# Patient Record
Sex: Female | Born: 1950 | Race: White | Hispanic: No | Smoking: Never smoker
Health system: Southern US, Community
[De-identification: ages and names within clinical notes are randomized; demographics above are authoritative.]

## PROBLEM LIST (undated history)

## (undated) DIAGNOSIS — F32A Depression, unspecified: Secondary | ICD-10-CM

## (undated) DIAGNOSIS — F419 Anxiety disorder, unspecified: Secondary | ICD-10-CM

## (undated) DIAGNOSIS — F329 Major depressive disorder, single episode, unspecified: Secondary | ICD-10-CM

## (undated) DIAGNOSIS — I10 Essential (primary) hypertension: Secondary | ICD-10-CM

## (undated) HISTORY — DX: Depression, unspecified: F32.A

## (undated) HISTORY — DX: Essential (primary) hypertension: I10

## (undated) HISTORY — PX: WRIST FRACTURE SURGERY: SHX121

## (undated) HISTORY — PX: WRIST SURGERY: SHX841

## (undated) HISTORY — DX: Anxiety disorder, unspecified: F41.9

## (undated) HISTORY — DX: Major depressive disorder, single episode, unspecified: F32.9

---

## 2008-12-10 HISTORY — PX: COLON SURGERY: SHX602

## 2009-11-09 HISTORY — PX: COLON RESECTION: SHX5231

## 2009-12-10 HISTORY — PX: COLON SURGERY: SHX602

## 2010-12-10 HISTORY — PX: OTHER SURGICAL HISTORY: SHX169

## 2014-12-10 DIAGNOSIS — I10 Essential (primary) hypertension: Secondary | ICD-10-CM

## 2014-12-10 HISTORY — DX: Essential (primary) hypertension: I10

## 2018-11-03 ENCOUNTER — Other Ambulatory Visit (HOSPITAL_COMMUNITY): Payer: BLUE CROSS/BLUE SHIELD | Attending: Psychiatry | Admitting: Licensed Clinical Social Worker

## 2018-11-03 DIAGNOSIS — Z818 Family history of other mental and behavioral disorders: Secondary | ICD-10-CM | POA: Insufficient documentation

## 2018-11-03 DIAGNOSIS — Z79899 Other long term (current) drug therapy: Secondary | ICD-10-CM | POA: Diagnosis not present

## 2018-11-03 DIAGNOSIS — Z915 Personal history of self-harm: Secondary | ICD-10-CM | POA: Diagnosis not present

## 2018-11-03 DIAGNOSIS — F332 Major depressive disorder, recurrent severe without psychotic features: Secondary | ICD-10-CM | POA: Diagnosis present

## 2018-11-03 DIAGNOSIS — R4589 Other symptoms and signs involving emotional state: Secondary | ICD-10-CM | POA: Insufficient documentation

## 2018-11-04 ENCOUNTER — Other Ambulatory Visit (HOSPITAL_COMMUNITY): Payer: BLUE CROSS/BLUE SHIELD | Admitting: Occupational Therapy

## 2018-11-04 ENCOUNTER — Encounter (HOSPITAL_COMMUNITY): Payer: Self-pay | Admitting: Family

## 2018-11-04 ENCOUNTER — Encounter (HOSPITAL_COMMUNITY): Payer: Self-pay | Admitting: Occupational Therapy

## 2018-11-04 ENCOUNTER — Other Ambulatory Visit: Payer: Self-pay

## 2018-11-04 ENCOUNTER — Other Ambulatory Visit (HOSPITAL_COMMUNITY): Payer: BLUE CROSS/BLUE SHIELD | Admitting: Licensed Clinical Social Worker

## 2018-11-04 VITALS — BP 128/78 | HR 90 | Ht 68.75 in | Wt 185.0 lb

## 2018-11-04 DIAGNOSIS — F070 Personality change due to known physiological condition: Secondary | ICD-10-CM

## 2018-11-04 DIAGNOSIS — R4589 Other symptoms and signs involving emotional state: Secondary | ICD-10-CM

## 2018-11-04 DIAGNOSIS — F332 Major depressive disorder, recurrent severe without psychotic features: Secondary | ICD-10-CM
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
## 2018-11-04 NOTE — Therapy (Signed)
[facility] [address] [place], [place], [address] Phone: [phone]   Fax:  [phone]  Occupational Therapy Evaluation  Patient Details  Name: Ashley Padilla MRN: [id number] Date of Birth: 13-Jan-1951 No data recorded  Encounter Date: 11/04/2018  OT End of Session - 11/04/18 0921    Visit Number  1    Number of Visits  16    Date for OT Re-Evaluation  12/02/18    Authorization Type  [employer]    Activity Tolerance  Patient tolerated treatment well    Behavior During Therapy  [facility] for tasks assessed/performed       History reviewed. No pertinent past medical history.  History reviewed. No pertinent surgical history.  There were no vitals filed for this visit.  Subjective Assessment - 11/04/18 0919    Currently in Pain?  No/denies       .OT assessment: OCAIRS  Diagnosis:   Past medical history/referral information: Pt presents to [facility] after a suicide attempt and stay in an inpatient hospital out of the [facility] system.  Subjective: Pt presents to group casual, well groomed. Pt is tearful and anxious and occasionally has difficulty coming to conclusions in conversation.  Living situation: Pt currently lives with son and daughter in law, but originally lives alone in [place] (husband has passed since 03-26-12)  ADLs/IADLs: Pt reports a decreased engagement in this area.  Sleep: Pt shares that she sleeps 7 hours with sleep medication  Work: Pt is a retired Education officer, museum  Leisure: Pt currently is not engaging in baseline leisure activities; at baseline pt attends group exercise, walks with two friends daily, takes Chief Technology Officer, and does a Mudlogger class  Social support: Pt identifies family as very supportive. Her two friends do not know the details of her situation, her daughter communicated to them saying that she is staying in [place] for the holidays.  Struggles: Coping skills  [place] Summary  of Client Scores:  FACILITATES PARTICIPATION IN OCCUPATION  ALLOWS PARTICIPATION IN OCCUPATION INHIBITS PARTICIPATION IN OCCUPATION RESTRICTS PARTICIPATION IN OCCUPATION COMMENTS  ROLES               X Pt currently not engaging in roles, significant change from baseline  HABITS               X No established current routine, pt is withdrawn and relocated from usual daily activities  PERSONAL CAUSATION               X Unable to identify areas of pride, becomes tearful; unable to think 6 months in future  VALUES              X  Loosely identifies family, becomes tearful  INTERESTS              X  Currently doing puzzle book, no other previously identified leisure activities noted  SKILLS              X  Pt difficulty with concentrating, states "I don't like to make decisions"; often gets lost in thought  [place]              X  Loosely identifies daily set/lead goals  LONG TERM GOALS               X None stated  INTERPETATION OF PAST EXPERIENCES              X  Increased  emphasis on negative, becomes tearful  PHYSICAL ENVIRONMENT              X  Limited currently being out of usual environment; pt not currently driving  SOCIAL ENVIRONMENT              X  Currently not socially engaging with others than the household  READINESS FOR CHANGE                         X  Difficulty adjusting    Need for Occupational Therapy:  4 Shows positive occupational participation, no need for OT.   3 Need for minimal intervention/consultative participation    X 2 Need for OT intervention indicated to restore/improve participation   1 Need for extensive OT intervention indicated to improve participation.  Referral for follow up services also recommended.    Assessment:  Patient demonstrates behavior that inhibits participation in occupation.  Patient will benefit from occupational therapy intervention in order to improve time management, financial management, stress management, job readiness skills, social  skills, and health management skills in preparation to return to full time community living and to be a productive community member.    Plan:  Patient will participate in skilled occupational therapy sessions individually or in a group setting to improve coping skills, psychosocial skills, and emotional skills required to return to prior level of function.  Treatment will be 4 times per week for 4 weeks.     OT TREATMENT   S: "I have learned to dismiss unhealthy thoughts, which is really helpful"    O: Continued education given on stress management this date. Pt given handouts to identify personal unhealthy vs healthy coping strategies and the outcomes of each. Further education given on stress management skills to use as pt reintegrates into BADL routine. Coping strategies taught include: relaxation based- deep breathing, counting to 10, taking a 1 minute vacation, acceptance, stress balls, relaxation audio/video, visual/mental imagery. Positive mental attitude- gratitude, acceptance, cognitive reframing, positive self talk, anger management.   A: Pt presents to group with blunted affect, engaged and participatory throughout session. Pt identifying that an unhealthy coping mechanisms is ruminating, and a positive one is to dismiss her unhealthy thoughts. Pt in understanding of stress management tools worksheet, stating she would like to engage in deep breathing.   P: OT will continue to follow up with stress management strategies to ensure successful implementaton             OT Education - 11/04/18 0920    Person(s) Educated  Patient    Methods  Explanation;Handout    Comprehension  Verbalized understanding       OT Short Term Goals - 11/04/18 0924      OT SHORT TERM GOAL #1   Title  Pt will be educated on strategies to improve psychosocial skills needed to participate fully in all daily, work, and leisure activities    Time  4    Period  Weeks    Status  New    [place]  Date  12/02/18      OT SHORT TERM GOAL #2   Title  Pt will apply psychosocial skills and coping mechanisms to daily activities in order to function independently and reintegrate into community dwelling    Time  4    Period  Weeks    Status  New    Target Date  12/02/18      OT SHORT TERM  GOAL #3   Title  Pt will recall and/or apply 1-3 sleep hygiene strategies to improve engagement in BADL routine    Time  4    Period  Weeks    Status  New    Target Date  12/02/18      OT SHORT TERM GOAL #4   Title  Pt will engage in goal setting to improve functional BADL/IADL routine upon reintegrating into community    Time  4    Period  Weeks    Status  New    Target Date  12/02/18               Plan - 11/04/18 0998    Occupational performance deficits (Please refer to evaluation for details):  ADL's;IADL's;Rest and Sleep;Leisure;Social Participation    Rehab Potential  Good    OT Frequency  4x / week    OT Duration  4 weeks    OT Treatment/Interventions  Psychosocial skills training;Coping strategies training;Self-care/ADL training;Other (comment)   community reintegration   Consulted and Agree with Plan of Care  Patient       Patient will benefit from skilled therapeutic intervention in order to improve the following deficits and impairments:  Decreased coping skills, Decreased psychosocial skills, Other (comment)(decreaed ability to engage in BADL and reintegrate into community)  Visit Diagnosis: Organic personality disorder  Difficulty coping    Problem List There are no active problems to display for this patient.  Zenovia Jarred, MSOT, OTR/L Behavioral Health OT/ Acute Relief OT PHP Office: [place] 11/04/2018, 9:33 AM  [facility] HOSPITALIZATION PROGRAM [place], [place], [address] Phone: [phone]   Fax:  [phone]  Name: Ashley Padilla MRN: [id number] Date of Birth: 1951/06/08
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
## 2018-11-04 NOTE — Progress Notes (Signed)
Patient presented with sad affect, depressed mood but denied any current suicidal or homicidal ideations, no auditory or visual hallucinations and no plans or intent to want to harm self or others at this time.  Patient reported "I promised my daughter" she would not attempt to harm herself again but admits to still having suicidal thoughts periodically primarily related to her feelings of guilt for past thoughts of attraction to 2 men that were not her husband.  Patient reported when she most recently attempted to overdose on pain medications she had from past surgeries, that she ran into a man she had worked with in a park with friends that she had had attractive thoughts for back in 2013.  Patient stated she never thought she would see him again and then she had this "overwhelming since of guilt" and the thought she had to attempt to harm herself to [facility] for these thoughts and to not bring harm to her children for having these. Patient reports although she does not attend church at this time she has often questioned her salvation and thought she was doing the right thing at the time for having such sinful thoughts. Patient presented with hyper-religious self-conviction and paranoid thinking, primarily around feelings of guilt for thoughts of attraction but admission she had never acted on any of these thoughts.  Patient reported her family, primarily her 3 children are her biggest supports and does not want to harm self currently and wants to feel better.  Patient reported last bout with depression was after having her children but is receptive to medication and attending PHP groups currently.  Discussed PHP with patient and encouraged her to share what she felt comfortable but also challenged her to try skills she would be learning.  Patient reported no current problems with Zoloft now at 150 mg a day since this past week, and also sleeping better with Seroquel at bedtime.  Patient rated her current level of  depression a 5, anxiety a 5, and hopelessness a 7-8 on a scale of 0-10 with 0 being none and 10 the worst she could manage.  Patient agreed to inform this nurse or PHP staff if any worsening of symptoms or if she again started feeling she needed to act on periodic suicidal ideations which she denies any currently.  Patient agreed to also inform this nurse or PHP staff if any problems with medication or side effects.  Patient stated sleeping about 7 sound hours a night now with Seroquel and some improving appetite.

## 2018-11-05 ENCOUNTER — Other Ambulatory Visit (HOSPITAL_COMMUNITY): Payer: BLUE CROSS/BLUE SHIELD | Admitting: Licensed Clinical Social Worker

## 2018-11-05 DIAGNOSIS — F332 Major depressive disorder, recurrent severe without psychotic features: Secondary | ICD-10-CM

## 2018-11-10 ENCOUNTER — Other Ambulatory Visit (HOSPITAL_COMMUNITY): Payer: Medicare Other | Attending: Psychiatry | Admitting: Licensed Clinical Social Worker

## 2018-11-10 ENCOUNTER — Other Ambulatory Visit (HOSPITAL_COMMUNITY): Payer: Medicare Other | Admitting: Occupational Therapy

## 2018-11-10 DIAGNOSIS — Z79899 Other long term (current) drug therapy: Secondary | ICD-10-CM | POA: Insufficient documentation

## 2018-11-10 DIAGNOSIS — F419 Anxiety disorder, unspecified: Secondary | ICD-10-CM | POA: Insufficient documentation

## 2018-11-10 DIAGNOSIS — F070 Personality change due to known physiological condition: Secondary | ICD-10-CM

## 2018-11-10 DIAGNOSIS — Z818 Family history of other mental and behavioral disorders: Secondary | ICD-10-CM | POA: Insufficient documentation

## 2018-11-10 DIAGNOSIS — I10 Essential (primary) hypertension: Secondary | ICD-10-CM | POA: Insufficient documentation

## 2018-11-10 DIAGNOSIS — Z811 Family history of alcohol abuse and dependence: Secondary | ICD-10-CM | POA: Diagnosis not present

## 2018-11-10 DIAGNOSIS — Z880 Allergy status to penicillin: Secondary | ICD-10-CM | POA: Diagnosis not present

## 2018-11-10 DIAGNOSIS — F332 Major depressive disorder, recurrent severe without psychotic features: Secondary | ICD-10-CM | POA: Insufficient documentation

## 2018-11-10 DIAGNOSIS — R4589 Other symptoms and signs involving emotional state: Secondary | ICD-10-CM

## 2018-11-11 ENCOUNTER — Other Ambulatory Visit (HOSPITAL_COMMUNITY): Payer: Medicare Other | Admitting: Licensed Clinical Social Worker

## 2018-11-11 ENCOUNTER — Encounter (HOSPITAL_COMMUNITY): Payer: Self-pay | Admitting: Occupational Therapy

## 2018-11-11 ENCOUNTER — Other Ambulatory Visit (HOSPITAL_COMMUNITY): Payer: Medicare Other | Admitting: Occupational Therapy

## 2018-11-11 ENCOUNTER — Encounter (HOSPITAL_COMMUNITY): Payer: Self-pay | Admitting: Family

## 2018-11-11 VITALS — BP 126/78 | HR 61 | Ht 68.75 in | Wt 186.0 lb

## 2018-11-11 DIAGNOSIS — F070 Personality change due to known physiological condition: Secondary | ICD-10-CM

## 2018-11-11 DIAGNOSIS — R4589 Other symptoms and signs involving emotional state: Secondary | ICD-10-CM

## 2018-11-11 DIAGNOSIS — F332 Major depressive disorder, recurrent severe without psychotic features: Secondary | ICD-10-CM

## 2018-11-11 MED ORDER — QUETIAPINE FUMARATE 50 MG PO TABS: 50.0000 mg | ORAL_TABLET | Freq: Every day | ORAL | 0 refills | Status: DC

## 2018-11-11 MED ORDER — SERTRALINE HCL 100 MG PO TABS: 100.0000 mg | ORAL_TABLET | Freq: Every day | ORAL | 0 refills | Status: DC

## 2018-11-11 MED ORDER — QUETIAPINE FUMARATE 25 MG PO TABS: 75.0000 mg | ORAL_TABLET | Freq: Every day | ORAL | 0 refills | Status: DC

## 2018-11-11 NOTE — Progress Notes (Signed)
Patient presented with sad affect, depressed mood and acknowledged she was a little upset the previous day when she found out her daughter-in-law had to go out of town for 2-3 days on a business trip.  Patient stated she "things have been going so much better and I just don't do well with changes".  Patient stated changes scare her a little but acknowledged there is a plan in place for her to continue group while her daughter-in-law is gone and she knows she will be fine with her son.  Patient also acknowledged feeling she may be a burden staying with her son and daughter-in-law at present but that they tell her they want her here to get better.  Patient in agreement at this time it is best to continue to stay with them and to allow them to help as she makes progress with depression.  Patient denied any current suicidal or homicidal ideations, no plan or intent to want to harm self or others at this time and rated her depression a 3, anxiety a 5, and hopelessness a 2-3 on a scale of 0-10 with 0 being none and 10 the worst she could manage.  Patient met with Ricky Ala, NP who lowered her Zoloft to 100 mg a day and increased her Seroquel to 25 mg, three at bedtime as patient acknowledged this has helped with her thoughts.  Patient reported sleeping much better, 7-8 sound hours a night and improved appetite.  Patient discussed plan to transition to IOP after finishes PHP and in agreement this may be a good next step due to admitted anxiety for any major changes.  Patient happy with PHP and reported several skills she consistently uses, such as grounding techniques to help re-frame negative thoughts when they occur, and states desire to continue to improve and no thoughts of wanting to harm self any longer at this time.

## 2018-11-11 NOTE — Therapy (Signed)
[place], [place], [address] Phone: [phone]   Fax:  [phone]  Occupational Therapy Treatment  Patient Details  Name: Ashley Padilla MRN: [id number] Date of Birth: 1951/07/24 Referring Provider (OT): Ricky Ala, NP   Encounter Date: 11/10/2018  OT End of Session - 11/11/18 0823    Visit Number  2    Number of Visits  16    Date for OT Re-Evaluation  12/02/18    Authorization Type  BCBS    OT Start Time  1100    OT Stop Time  1200    OT Time Calculation (min)  60 min    Activity Tolerance  Patient tolerated treatment well    Behavior During Therapy  [facility] for tasks assessed/performed       Past Medical History:  Diagnosis Date  . Hypertension 2016    Past Surgical History:  Procedure Laterality Date  . COLON SURGERY  2010   Colon Resection  . COLON SURGERY  2011   Ostomy reversal     There were no vitals filed for this visit.  Subjective Assessment - 11/11/18 0823    Currently in Pain?  No/denies        S: "I do not normally live here so my time management and scheduling is very different"   O: Education received on time management to help increase occupational balance and quality of life. Time management activity given, with teams of 2 listing names of states on blank map in set amount of time. Reflection with group given on strategy, purpose, and feelings on activity. Activities wheel activity administered with pt to identify hours devoted to: work/obligations, leisure/relaxation, self-care/caregiving, and sleep/rest. Further education given to allow pt to better balance activity wheel for increased occupational balance. Weekly planner handouts given at end of session with education on different modalities of organization (planner,?apps, computer, to do lists, etc.) so pt could choose preferred avenue. Further education given on the importance of time management in community  reintegration from [facility] and how to continue using skills. Additional information given on medication management, from the aspect of lifestyle/organizational skills. Pt given medication chart and various apps to use to help increase compliance of medication while implementing it into BADL routine.   A:?Pt presents to group with blunted affect, engaged and participatory throughout session. Pt completed time management activity, stating she was not pressured by the time limit, but had a strategy of team work. Pt completed activities wheel activity, stating that her schedule is very different from baseline because she is staying with her children rather than on her own in her own home. She shares that this is an adjustment and she does not have as much leisure/social time. She shares that she has a method with her medications by labeling the bottles for morning vs night. She also uses alarms on her phone, but shares that she will research medication management apps.  P: Pt provided with education on time management skills to increase occupational balance and quality of life. OT will continue to follow up with pt for successful implementation into daily life.?                    OT Education - 11/11/18 [phone]    Education Details  education given on time management skills    Person(s) Educated  Patient    Methods  Explanation;Handout    Comprehension  Verbalized understanding  OT Short Term Goals - 11/11/18 0824      OT SHORT TERM GOAL #1   Title  Pt will be educated on strategies to improve psychosocial skills needed to participate fully in all daily, work, and leisure activities    Time  4    Period  Weeks    Status  On-going    Target Date  12/02/18      OT SHORT TERM GOAL #2   Title  Pt will apply psychosocial skills and coping mechanisms to daily activities in order to function independently and reintegrate into community dwelling    Time  4    Period  Weeks    Status   On-going    Target Date  12/02/18      OT SHORT TERM GOAL #3   Title  Pt will recall and/or apply 1-3 sleep hygiene strategies to improve engagement in BADL routine    Time  4    Period  Weeks    Status  On-going    Target Date  12/02/18      OT SHORT TERM GOAL #4   Title  Pt will engage in goal setting to improve functional BADL/IADL routine upon reintegrating into community    Time  4    Period  Weeks    Status  On-going    Target Date  12/02/18               Plan - 11/11/18 2376    Occupational performance deficits (Please refer to evaluation for details):  ADL's;IADL's;Rest and Sleep;Leisure;Social Participation       Patient will benefit from skilled therapeutic intervention in order to improve the following deficits and impairments:  Decreased coping skills, Decreased psychosocial skills, Other (comment)(decreased ability to engage in BADL and reintegrate into community)  Visit Diagnosis: Organic personality disorder  Difficulty coping    Problem List There are no active problems to display for this patient.  Zenovia Jarred, MSOT, OTR/L Behavioral Health OT/ Acute Relief OT PHP Office: [place] 11/11/2018, 8:25 AM  [facility] HOSPITALIZATION PROGRAM [place], [place], [address] Phone: [phone]   Fax:  [phone]  Name: Ashley Padilla MRN: [id number] Date of Birth: September 02, 1951

## 2018-11-11 NOTE — Progress Notes (Signed)
BH MD/PA/NP OP Progress Note  11/11/2018 10:33 AM Ashley Padilla  MRN:  [id number]   Evaluation:  Ashley Padilla observed attending daily group session.  She is awake alert and oriented x3 presents pleasant but guarded.  Reports feeling better overall however continues to ruminate with the unknown of the foreseeable future. Reports she has discussed with her family regarding follow-up/ discharge plans for her  "life after this program".  Ashley Padilla expressed she feels that she is a burden to her son and daughter-in-law however knows they do not feel that way.  Continues to deny suicidal or homicidal ideations. Continue to reports symptoms of worry with the potential of  seen her coworker in passing as another  chance encounter.   Patient was prescribed Zoloft 150 mg and Seroquel 25 mg.  Discussed titration of medication.  Patient to initiate Seroquel 50 mg p.o. nightly and decrease Zoloft to 100 mg, patient appeared to be agreeable to treatment plan.  Rates her depression 3 out of 10 with 10 being the worst.  Reports a good appetite.  States she is resting well throughout the night.  Support encouragement reassurance was provided.  History: Per assessment note from [facility] note: Pt was admitted at the hospital in [place] from 10/23/18 - 10/28/18 due to a suicide attempt and continued SI. 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 Pt states SI began approximately 2 weeks prior to her hospitalization due to an encounter from an old co-worker. Pt reports this is a man that she was attracted to when they worked together, for which she feels very guilty because she was married. Pt states the man shook her hand when they ran into her and that sent her into a spiral. Pt is unable to clearly say what it was about the interaction that undid her, but does share feelings of guilt and  fearing she would bring "spiritual harm" to her children.  Pt is unable to define what "spiritual harm" meant or why she had that concern. Pt does state "I struggle with my salvation and I think that's the root of all this." Pt reports conflict with thoughts that she considers "sinful" and forgiveness.    Visit Diagnosis: No diagnosis found.  Past Psychiatric History:   Past Medical History:  Past Medical History:  Diagnosis Date  . Hypertension 2016    Past Surgical History:  Procedure Laterality Date  . COLON SURGERY  2010   Colon Resection  . COLON SURGERY  2011   Ostomy reversal     Family Psychiatric History:   Family History:  Family History  Problem Relation Age of Onset  . Anxiety disorder Mother   . Depression Mother   . Alcohol abuse Father   . Anxiety disorder Sister   . Depression Sister   . Anxiety disorder Maternal Grandmother   . Depression Maternal Grandmother     Social History:  Social History   Socioeconomic History  . Marital status: Unknown    Spouse name: Not on file  . Number of children: Not on file  . Years of education: Not on file  . Highest education level: Not on file  Occupational History  . Not on file  Social Needs  . Financial resource strain: Not very hard  . Food insecurity:    Worry: Never true    Inability: Never true  . Transportation needs:  Medical: No    Non-medical: No  Tobacco Use  . Smoking status: Never Smoker  . Smokeless tobacco: Never Used  Substance and Sexual Activity  . Alcohol use: Not Currently  . Drug use: Not Currently  . Sexual activity: Not Currently  Lifestyle  . Physical activity:    Days per week: 7 days    Minutes per session: 40 min  . Stress: Very much  Relationships  . Social connections:    Talks on phone: More than three times a week    Gets together: Once a week    Attends religious service: Never    Active member of club or organization: No    Attends meetings of clubs or  organizations: Never    Relationship status: Widowed  Other Topics Concern  . Not on file  Social History Narrative  . Not on file    Allergies:  Allergies  Allergen Reactions  . Penicillins Other (See Comments)    Reports had a reaction as a baby but not sure what occurred.   . Adhesive [Tape] Rash    Metabolic Disorder Labs: No results found for: HGBA1C, MPG No results found for: PROLACTIN No results found for: CHOL, TRIG, HDL, CHOLHDL, VLDL, LDLCALC No results found for: TSH  Therapeutic Level Labs: No results found for: LITHIUM No results found for: VALPROATE No components found for:  CBMZ  Current Medications: Current Outpatient Medications  Medication Sig Dispense Refill  . lisinopril (PRINIVIL,ZESTRIL) 20 MG tablet Take 20 mg by mouth daily.    . QUEtiapine (SEROQUEL) 50 MG tablet Take 50 mg by mouth at bedtime.  0  . sertraline (ZOLOFT) 100 MG tablet Take 150 mg by mouth daily.  0   No current facility-administered medications for this visit.      Musculoskeletal: Strength & Muscle Tone: within normal limits Gait & Station: normal Patient leans: N/A  Psychiatric Specialty Exam: Review of Systems  Psychiatric/Behavioral: Positive for depression. Negative for hallucinations and suicidal ideas. The patient is nervous/anxious. The patient does not have insomnia.   All other systems reviewed and are negative.   There were no vitals taken for this visit.There is no height or weight on file to calculate BMI.  General Appearance: Casual  Eye Contact:  Fair  Speech:  Clear and Coherent  Volume:  Normal  Mood:  Anxious and Depressed  Affect:  Congruent  Thought Process:  Coherent  Orientation:  Full (Time, Place, and Person)  Thought Content: WDL   Suicidal Thoughts:  No  Homicidal Thoughts:  No  Memory:  Immediate;   Fair Recent;   Fair Remote;   Fair  Judgement:  Fair  Insight:  Fair  Psychomotor Activity:  Normal  Concentration:  Concentration: Fair   Recall:  [employer] of Knowledge: Fair  Language: Fair  Akathisia:  No  Handed:  Right  AIMS (if indicated):   Assets:  Communication Skills Desire for Improvement Resilience Social Support  ADL's:  Intact  Cognition: WNL  Sleep:  Fair   Screenings: GAD-7     Counselor from 11/05/2018 in [place]  Total GAD-7 Score  13    PHQ2-9     Counselor from 11/04/2018 in [place] Most recent reading at 11/04/2018 11:51 AM Counselor from 11/05/2018 in [place] Most recent reading at 11/04/2018  9:00 AM  PHQ-2 Total Score  6  4  PHQ-9 Total Score  16  13  Assessment and Plan:  Continue PHP - decreased Zoloft 150 mg to 100 mg and Increased Seroquel 50 mg  To 75mg  po QHS   Treatment plan was reviewed and agreed upon by T. Bobby Rumpf and patient Marlies Ligman need for continued group services.  Derrill [place], NP 11/11/2018, 10:33 AM

## 2018-11-11 NOTE — Therapy (Signed)
[place], [place], [address] Phone: [phone]   Fax:  [phone]  Occupational Therapy Treatment  Patient Details  Name: Ashley Padilla MRN: [id number] Date of Birth: 12-03-1951 Referring Provider (OT): Ricky Ala, NP   Encounter Date: 11/11/2018  OT End of Session - 11/11/18 1352    Visit Number  3    Number of Visits  16    Date for OT Re-Evaluation  12/02/18    Authorization Type  BCBS    OT Start Time  1100    OT Stop Time  1200    OT Time Calculation (min)  60 min    Activity Tolerance  Patient tolerated treatment well    Behavior During Therapy  [facility] for tasks assessed/performed       Past Medical History:  Diagnosis Date  . Hypertension 2016    Past Surgical History:  Procedure Laterality Date  . COLON SURGERY  2010   Colon Resection  . COLON SURGERY  2011   Ostomy reversal     There were no vitals filed for this visit.  Subjective Assessment - 11/11/18 1352    Currently in Pain?  No/denies       S: "I watch a lot of TV in bed"   O: Pt educated on sleep hygiene as it pertains to daily life/routines this date.  Education given on appropriate sleep routines, sleep disorders, detriments of too much/too little sleep with encouraged feedback of personal Experiences. Sleep diary handout given to challenge pt to track current habits and identify area for change. Further education given on relaxation techniques to implement before bed. Pt asked to identify one STG in relation to sleep hygiene to create better daily sleep habits. Informational video shown on common sleep myths to help increase understanding.  A: Pt presents with appropriate affect, engaged and participatory throughout entirety of session. Education received in a positive manner to help improve sleep hygiene in daily life. Pt agreeable to use sleep diary this date. Pt identified goal of limiting TV time when in bed, and  increasing the consistency of her bed time routine. Pt in understanding of educational video shown.  P: Pt provided with skills to increase sleep hygiene habits into daily routine. OT will continue to follow up with communication skills for successful implementation into daily life.                     OT Education - 11/11/18 1352    Education Details  education given on sleep hygiene    Person(s) Educated  Patient    Methods  Explanation;Handout    Comprehension  Verbalized understanding       OT Short Term Goals - 11/11/18 0824      OT SHORT TERM GOAL #1   Title  Pt will be educated on strategies to improve psychosocial skills needed to participate fully in all daily, work, and leisure activities    Time  4    Period  Weeks    Status  On-going    Target Date  12/02/18      OT SHORT TERM GOAL #2   Title  Pt will apply psychosocial skills and coping mechanisms to daily activities in order to function independently and reintegrate into community dwelling    Time  4    Period  Weeks    Status  On-going    Target Date  12/02/18  OT SHORT TERM GOAL #3   Title  Pt will recall and/or apply 1-3 sleep hygiene strategies to improve engagement in BADL routine    Time  4    Period  Weeks    Status  On-going    Target Date  12/02/18      OT SHORT TERM GOAL #4   Title  Pt will engage in goal setting to improve functional BADL/IADL routine upon reintegrating into community    Time  4    Period  Weeks    Status  On-going    Target Date  12/02/18               Plan - 11/11/18 1352    Occupational performance deficits (Please refer to evaluation for details):  ADL's;IADL's;Rest and Sleep;Leisure;Social Participation       Patient will benefit from skilled therapeutic intervention in order to improve the following deficits and impairments:  Decreased coping skills, Decreased psychosocial skills, Other (comment)(decreased ability to engage in BADL and  reintegrate into community dwelling)  Visit Diagnosis: Organic personality disorder  Difficulty coping    Problem List There are no active problems to display for this patient.  Zenovia Jarred, MSOT, OTR/L Behavioral Health OT/ Acute Relief OT PHP Office: [place] 11/11/2018, 1:55 PM  [place], [place], [address] Phone: [phone]   Fax:  [phone]  Name: Ashley Padilla MRN: [id number] Date of Birth: 03/16/1951

## 2018-11-12 ENCOUNTER — Other Ambulatory Visit (HOSPITAL_COMMUNITY): Payer: Medicare Other | Admitting: Licensed Clinical Social Worker

## 2018-11-12 DIAGNOSIS — F332 Major depressive disorder, recurrent severe without psychotic features: Secondary | ICD-10-CM | POA: Diagnosis not present

## 2018-11-12 NOTE — Progress Notes (Signed)
Spiritual care group 11/12/2018 11:00-12:00  Facilitated by Simone Curia, MDiv    Group focused on topic of "self-care"  Patients engaged in facilitated discussion about topic.  Explored quotes related to self care and chose one which they agreed with and one which they disliked.  Engaged in discussion around quote choices and their experience / understanding of care for themselves.   Ashley Padilla was present throughout group.  Alert and oriented, she engaged in group discussion voluntarily.  Openly engaged with facilitator when prompted, did not engage with other group members.   Related self care to a story of setting boundaries with another patient within the inpatient context.  Stated that she feels some guilt around this, but also recognized this was a way of caring for herself.  Connected with another group member around "being a people pleaser" and spoke about difficulty of asserting boundaries due to fear that she would hurt others.   Stated she was not sure how she wants to practice boundaries in her life at present.

## 2018-11-13 ENCOUNTER — Other Ambulatory Visit (HOSPITAL_COMMUNITY): Payer: Medicare Other | Admitting: Licensed Clinical Social Worker

## 2018-11-13 ENCOUNTER — Other Ambulatory Visit (HOSPITAL_COMMUNITY): Payer: Medicare Other | Admitting: Occupational Therapy

## 2018-11-13 ENCOUNTER — Encounter (HOSPITAL_COMMUNITY): Payer: Self-pay | Admitting: Occupational Therapy

## 2018-11-13 DIAGNOSIS — F332 Major depressive disorder, recurrent severe without psychotic features: Secondary | ICD-10-CM | POA: Diagnosis not present

## 2018-11-13 DIAGNOSIS — R4589 Other symptoms and signs involving emotional state: Secondary | ICD-10-CM

## 2018-11-13 DIAGNOSIS — F070 Personality change due to known physiological condition: Secondary | ICD-10-CM

## 2018-11-13 NOTE — Therapy (Signed)
[place], [place], [address] Phone: [phone]   Fax:  [phone]  Occupational Therapy Treatment  Patient Details  Name: Ashley Padilla MRN: [id number] Date of Birth: 02-15-1951 Referring Provider (OT): Ricky Ala, NP   Encounter Date: 11/13/2018  OT End of Session - 11/13/18 1400    Visit Number  4    Number of Visits  16    Date for OT Re-Evaluation  12/02/18    Authorization Type  BCBS    OT Start Time  1100    OT Stop Time  1200    OT Time Calculation (min)  60 min    Activity Tolerance  Patient tolerated treatment well    Behavior During Therapy  [facility] for tasks assessed/performed       Past Medical History:  Diagnosis Date  . Hypertension 2016    Past Surgical History:  Procedure Laterality Date  . COLON SURGERY  2010   Colon Resection  . COLON SURGERY  2011   Ostomy reversal     There were no vitals filed for this visit.  Subjective Assessment - 11/13/18 1400    Currently in Pain?  No/denies        S: "I am retired, but I want to learn a new language"  O: Education given on goals/values and how to set appropriate goals for success while reintegrating into the community. Pt given goal identifying worksheet to list immediate, short term, medium term, and long-term goals using a SMART goal framework (specificity, meaningful, adaptive, realistic, and time bound). Goals created as guideline for pt to practice being accountable in various situations. Pt completed work sheet of goals and encouraged to share goals with the group, with emphasis on immediate goal for check in with pt for next session to maintain accountability. Art activity to be created by pt, to display on group wall for continued visual cue/accountability of set goals.   A: Pt presents to group with appropriate affect, engaged and participatory throughout session. Pt completed goals work sheet using SMART goal  framework, while remaining in line with values for promotion of occupational balance to help foster continuous practice of accountability skills. Pt identified immediate goal of "I am going to re-download my spanish skill building app", progressing to long term goal of finding a class to learn spanish and keeping track of progress. Pt engaged in art activity.   P: Pt provided with education on improving accountability. OT will continue to follow up with accountability and goal/value setting skills for successful implementation into daily life.                     OT Education - 11/13/18 1400    Education Details  education given on goals/value setting    Person(s) Educated  Patient    Methods  Explanation;Handout    Comprehension  Verbalized understanding       OT Short Term Goals - 11/11/18 0824      OT SHORT TERM GOAL #1   Title  Pt will be educated on strategies to improve psychosocial skills needed to participate fully in all daily, work, and leisure activities    Time  4    Period  Weeks    Status  On-going    Target Date  12/02/18      OT SHORT TERM GOAL #2   Title  Pt will apply psychosocial skills and coping mechanisms to daily activities in  order to function independently and reintegrate into community dwelling    Time  4    Period  Weeks    Status  On-going    Target Date  12/02/18      OT SHORT TERM GOAL #3   Title  Pt will recall and/or apply 1-3 sleep hygiene strategies to improve engagement in BADL routine    Time  4    Period  Weeks    Status  On-going    Target Date  12/02/18      OT SHORT TERM GOAL #4   Title  Pt will engage in goal setting to improve functional BADL/IADL routine upon reintegrating into community    Time  4    Period  Weeks    Status  On-going    Target Date  12/02/18               Plan - 11/13/18 1400    Occupational performance deficits (Please refer to evaluation for details):  ADL's;IADL's;Rest and  Sleep;Leisure;Social Participation       Patient will benefit from skilled therapeutic intervention in order to improve the following deficits and impairments:  Decreased coping skills, Decreased psychosocial skills, Other (comment)(decreased ability to engage in BADL and reintegrate into community)  Visit Diagnosis: Organic personality disorder  Difficulty coping    Problem List There are no active problems to display for this patient.  Zenovia Jarred, MSOT, OTR/L Behavioral Health OT/ Acute Relief OT PHP Office: [phone]  Zenovia Jarred 11/13/2018, 2:01 PM  [facility] HOSPITALIZATION PROGRAM [place], [place], [address] Phone: [phone]   Fax:  [phone]  Name: Ashley Padilla MRN: [id number] Date of Birth: Aug 29, 1951

## 2018-11-14 ENCOUNTER — Other Ambulatory Visit (HOSPITAL_COMMUNITY): Payer: Medicare Other | Admitting: Occupational Therapy

## 2018-11-14 ENCOUNTER — Other Ambulatory Visit (HOSPITAL_COMMUNITY): Payer: Medicare Other | Admitting: Licensed Clinical Social Worker

## 2018-11-14 ENCOUNTER — Encounter (HOSPITAL_COMMUNITY): Payer: Self-pay | Admitting: Occupational Therapy

## 2018-11-14 DIAGNOSIS — R4589 Other symptoms and signs involving emotional state: Secondary | ICD-10-CM

## 2018-11-14 DIAGNOSIS — F070 Personality change due to known physiological condition: Secondary | ICD-10-CM

## 2018-11-14 DIAGNOSIS — F332 Major depressive disorder, recurrent severe without psychotic features: Secondary | ICD-10-CM | POA: Diagnosis not present

## 2018-11-14 NOTE — Therapy (Signed)
[place], [place], [address] Phone: [phone]   Fax:  [phone]  Occupational Therapy Treatment  Patient Details  Name: Ashley Padilla MRN: [id number] Date of Birth: [birth year] Referring Provider (OT): Ricky Ala, NP   Encounter Date: 11/14/2018  OT End of Session - 11/14/18 1311    Visit Number  5    Number of Visits  16    Date for OT Re-Evaluation  12/02/18    Authorization Type  BCBS    OT Start Time  1100    OT Stop Time  1200    OT Time Calculation (min)  60 min    Activity Tolerance  Patient tolerated treatment well    Behavior During Therapy  [facility] for tasks assessed/performed       Past Medical History:  Diagnosis Date  . Hypertension 2016    Past Surgical History:  Procedure Laterality Date  . COLON SURGERY  2010   Colon Resection  . COLON SURGERY  2011   Ostomy reversal     There were no vitals filed for this visit.  Subjective Assessment - 11/14/18 1310    Currently in Pain?  No/denies       S: "I have used one oil before"   O: Pt presented with education and exploration of essential oils this date for an alternative approach to managing current symptoms. Pt encouraged to explore and share with other group members their experiences and preferences with essential oils. Pt given the opportunity to place preferred scents on cotton balls and take home. Pt given education on safety of oils. Additionally pt given tips to apply essential oils to relaxation techniques such as a hot bath, meditation, massage, etc. Deep breathing exercises given and discussed with return demonstration. Pt to identify one goal to implement essential oils in BADL/IADL routine as additional coping mechanism.   A: Pt presents to group with blunted affect, engaged and participatory throughout entirery of session. Pt mentions little experience with . Pt engaged with oils with brightened affect and  increased excitement, taking samples to carry over this practice. One goal pt stated is she wants to use them relaxing. Pt in understanding of breathing techniques.   P: OT will continue follow up with essential oils education to ensure appropriate education whe applying to daily BADL routine.                     OT Education - 11/14/18 1310    Education Details  education given on stress management    Person(s) Educated  Patient    Methods  Explanation;Handout    Comprehension  Verbalized understanding       OT Short Term Goals - 11/11/18 0824      OT SHORT TERM GOAL #1   Title  Pt will be educated on strategies to improve psychosocial skills needed to participate fully in all daily, work, and leisure activities    Time  4    Period  Weeks    Status  On-going    Target Date  12/02/18      OT SHORT TERM GOAL #2   Title  Pt will apply psychosocial skills and coping mechanisms to daily activities in order to function independently and reintegrate into community dwelling    Time  4    Period  Weeks    Status  On-going    Target Date  12/02/18  OT SHORT TERM GOAL #3   Title  Pt will recall and/or apply 1-3 sleep hygiene strategies to improve engagement in BADL routine    Time  4    Period  Weeks    Status  On-going    Target Date  12/02/18      OT SHORT TERM GOAL #4   Title  Pt will engage in goal setting to improve functional BADL/IADL routine upon reintegrating into community    Time  4    Period  Weeks    Status  On-going    Target Date  12/02/18               Plan - 11/14/18 1311    Occupational performance deficits (Please refer to evaluation for details):  ADL's;IADL's;Rest and Sleep;Leisure;Social Participation       Patient will benefit from skilled therapeutic intervention in order to improve the following deficits and impairments:  Decreased coping skills, Decreased psychosocial skills, Other (comment)(decreased ability to engage in  BADL and reintegrate into community)  Visit Diagnosis: Organic personality disorder  Difficulty coping    Problem List There are no active problems to display for this patient.  Zenovia Jarred, MSOT, OTR/L Behavioral Health OT/ Acute Relief OT PHP Office: [phone]  Zenovia Jarred 11/14/2018, 1:12 PM  [facility] HOSPITALIZATION PROGRAM [place], [place], [address] Phone: [phone]   Fax:  [phone]  Name: Tanyiah Laurich MRN: [id number] Date of Birth: 28-Feb-1951

## 2018-11-17 ENCOUNTER — Other Ambulatory Visit (HOSPITAL_COMMUNITY): Payer: Medicare Other | Admitting: Licensed Clinical Social Worker

## 2018-11-17 ENCOUNTER — Other Ambulatory Visit (HOSPITAL_COMMUNITY): Payer: Medicare Other | Admitting: Occupational Therapy

## 2018-11-17 ENCOUNTER — Encounter (HOSPITAL_COMMUNITY): Payer: Self-pay | Admitting: Occupational Therapy

## 2018-11-17 DIAGNOSIS — F070 Personality change due to known physiological condition: Secondary | ICD-10-CM

## 2018-11-17 DIAGNOSIS — F332 Major depressive disorder, recurrent severe without psychotic features: Secondary | ICD-10-CM

## 2018-11-17 DIAGNOSIS — R4589 Other symptoms and signs involving emotional state: Secondary | ICD-10-CM

## 2018-11-17 NOTE — Therapy (Signed)
[name], [place], [address] Phone: [phone]   Fax:  [phone]  Occupational Therapy Treatment  Patient Details  Name: Ashley Padilla MRN: [id number] Date of Birth: 02-Jan-1951 Referring Provider (OT): Ricky Ala, NP   Encounter Date: 11/17/2018  OT End of Session - 11/17/18 1400    Visit Number  6    Number of Visits  16    Date for OT Re-Evaluation  12/02/18    Authorization Type  BCBS    OT Start Time  1100    OT Stop Time  1200    OT Time Calculation (min)  60 min    Activity Tolerance  Patient tolerated treatment well    Behavior During Therapy  [facility] for tasks assessed/performed       Past Medical History:  Diagnosis Date  . Hypertension 2016    Past Surgical History:  Procedure Laterality Date  . COLON SURGERY  2010   Colon Resection  . COLON SURGERY  2011   Ostomy reversal     There were no vitals filed for this visit.  Subjective Assessment - 11/17/18 1400    Currently in Pain?  No/denies        S: "My self esteem is below a 5/10"   O: Education given on definition and importance of positive self-esteem in daily life and relationships with focus on using positive self-talk. Further education given on the relationship between self-esteem and mental illness and both high and low self-esteem factors. Worksheet given for pt to identify a positive trait about themselves with each letter of the alphabet to use as reference for future times of need and to gain insight on numerous positive qualities. Pt encouraged to share at end of session.  A: Pt presents to group with appropriate affect, engaged and participatory throughout session. Pt engaged in discussion, stating comparisons can be damaging to her self esteem. Hobbies and interests help increase her self esteem. Pt completed self-esteem alphabet activity with minimal verbal cues. Pt also provided support to other group members  to help brainstorm ideas. Pt shared at end of session, with increased affect.  P: Pt provided with self-esteem boosting skills to implement into a variety of daily activities/routines. OT will continue to follow up for successful implementation into daily life.                     OT Education - 11/17/18 1400    Education Details  education given on self esteem    Person(s) Educated  Patient    Methods  Explanation;Handout    Comprehension  Verbalized understanding       OT Short Term Goals - 11/11/18 0824      OT SHORT TERM GOAL #1   Title  Pt will be educated on strategies to improve psychosocial skills needed to participate fully in all daily, work, and leisure activities    Time  4    Period  Weeks    Status  On-going    Target Date  12/02/18      OT SHORT TERM GOAL #2   Title  Pt will apply psychosocial skills and coping mechanisms to daily activities in order to function independently and reintegrate into community dwelling    Time  4    Period  Weeks    Status  On-going    Target Date  12/02/18      OT SHORT TERM GOAL #3  Title  Pt will recall and/or apply 1-3 sleep hygiene strategies to improve engagement in BADL routine    Time  4    Period  Weeks    Status  On-going    Target Date  12/02/18      OT SHORT TERM GOAL #4   Title  Pt will engage in goal setting to improve functional BADL/IADL routine upon reintegrating into community    Time  4    Period  Weeks    Status  On-going    Target Date  12/02/18               Plan - 11/17/18 1400    Occupational performance deficits (Please refer to evaluation for details):  ADL's;IADL's;Rest and Sleep;Leisure;Social Participation       Patient will benefit from skilled therapeutic intervention in order to improve the following deficits and impairments:  Decreased coping skills, Decreased psychosocial skills, Other (comment)(decreased ability to engage in BADL and reintegrate into  community)  Visit Diagnosis: Organic personality disorder  Difficulty coping    Problem List There are no active problems to display for this patient.  Zenovia Jarred, MSOT, OTR/L Behavioral Health OT/ Acute Relief OT PHP Office: [phone]  Zenovia Jarred 11/17/2018, 2:03 PM  [facility] HOSPITALIZATION PROGRAM [place], [place], [address] Phone: [phone]   Fax:  [phone]  Name: Ashley Padilla MRN: [id number] Date of Birth: 1951-02-06

## 2018-11-18 ENCOUNTER — Other Ambulatory Visit (HOSPITAL_COMMUNITY): Payer: Medicare Other | Admitting: Licensed Clinical Social Worker

## 2018-11-18 ENCOUNTER — Telehealth (HOSPITAL_COMMUNITY): Payer: Self-pay | Admitting: Professional

## 2018-11-18 ENCOUNTER — Encounter (HOSPITAL_COMMUNITY): Payer: Self-pay | Admitting: Occupational Therapy

## 2018-11-18 ENCOUNTER — Other Ambulatory Visit (HOSPITAL_COMMUNITY): Payer: Medicare Other | Admitting: Occupational Therapy

## 2018-11-18 DIAGNOSIS — F332 Major depressive disorder, recurrent severe without psychotic features: Secondary | ICD-10-CM

## 2018-11-18 DIAGNOSIS — F070 Personality change due to known physiological condition: Secondary | ICD-10-CM

## 2018-11-18 DIAGNOSIS — R4589 Other symptoms and signs involving emotional state: Secondary | ICD-10-CM

## 2018-11-18 NOTE — Therapy (Signed)
[place], [place], [address] Phone: [phone]   Fax:  [phone]  Occupational Therapy Treatment  Patient Details  Name: Ashley Padilla MRN: [id number] Date of Birth: 1951/11/24 Referring Provider (OT): Ricky Ala, NP   Encounter Date: 11/18/2018  OT End of Session - 11/18/18 1358    Visit Number  7    Number of Visits  16    Date for OT Re-Evaluation  12/02/18    Authorization Type  BCBS    OT Start Time  1100    OT Stop Time  1200    OT Time Calculation (min)  60 min    Activity Tolerance  Patient tolerated treatment well    Behavior During Therapy  [facility] for tasks assessed/performed       Past Medical History:  Diagnosis Date  . Hypertension 2016    Past Surgical History:  Procedure Laterality Date  . COLON SURGERY  2010   Colon Resection  . COLON SURGERY  2011   Ostomy reversal     There were no vitals filed for this visit.  Subjective Assessment - 11/18/18 1357    Currently in Pain?  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 Pt to circle from a list of strengths they believe to possess, with the opportunity to write their own strengths. Pt then to discuss how strengths have been an advantage in past situations, and how different strengths can be of benefit in the future. This past vs future model was applied to explore relationships, professional life, and personal fulfillment. Pt encouraged to share throughout session and reflect with other group members. Self esteem activity to be completed to help highlight strong qualities in others.  A: Pt presents to group with appropriate affect, engaged and participatory throughout sessoin. Pt identified strengths as "love of learning and empathy". She also states "well  my list is a lot shorter than others". 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 Pt highly engaged in self esteem activity for partner, noting an increase in affect.  P: OT will continue to follow up to ensure knowledge of strengths is applied affectively when developing psychosocial skills for community reintegration.                   OT Education - 11/18/18 1357    Education Details  further education given on self esteem as it relates to strength exploration    Person(s) Educated  Patient    Methods  Explanation;Handout    Comprehension  Verbalized understanding       OT Short Term Goals - 11/11/18 0824      OT SHORT TERM GOAL #1   Title  Pt will be educated on strategies to improve psychosocial skills needed to participate fully in all daily, work, and leisure activities    Time  4    Period  Weeks    Status  On-going    Target Date  12/02/18      OT SHORT TERM GOAL #2   Title  Pt will apply psychosocial skills and coping mechanisms to daily activities in order to function independently and reintegrate into community dwelling  Time  4    Period  Weeks    Status  On-going    Target Date  12/02/18      OT SHORT TERM GOAL #3   Title  Pt will recall and/or apply 1-3 sleep hygiene strategies to improve engagement in BADL routine    Time  4    Period  Weeks    Status  On-going    Target Date  12/02/18      OT SHORT TERM GOAL #4   Title  Pt will engage in goal setting to improve functional BADL/IADL routine upon reintegrating into community    Time  4    Period  Weeks    Status  On-going    Target Date  12/02/18               Plan - 11/18/18 1358    Occupational performance deficits (Please refer to evaluation for details):  ADL's;IADL's;Rest and Sleep;Leisure;Social Participation       Patient will benefit from skilled therapeutic intervention in order to improve the following  deficits and impairments:  Decreased coping skills, Decreased psychosocial skills, Other (comment)(decreased ability to engage in BADL and reintegrate into community)  Visit Diagnosis: Organic personality disorder  Difficulty coping    Problem List There are no active problems to display for this patient.  Zenovia Jarred, MSOT, OTR/L Behavioral Health OT/ Acute Relief OT PHP Office: [place] 11/18/2018, 1:59 PM  [facility] PARTIAL HOSPITALIZATION PROGRAM [place], [place], [address] Phone: [phone]   Fax:  [phone]  Name: Ashley Padilla MRN: [id number] Date of Birth: 11-07-1951

## 2018-11-18 NOTE — Psych (Signed)
[facility] BH PHP THERAPIST PROGRESS NOTE  Ashley Padilla [id number]  Session Time: 9:00 - 11:00  Participation Level: Minimal  Behavioral Response: CasualAlertAnxious  Type of Therapy: Group Therapy  Treatment Goals addressed: Coping  Interventions: CBT, DBT, Solution Focused, Supportive and Reframing  Summary: Clinician led check-in regarding current stressors and situation, and review of patient completed daily inventory. Clinician utilized active listening and empathetic response and validated patient emotions. Clinician facilitated processing group on pertinent issues.   Therapist Response: [name] is a 67 y.o. female who presents with depression and anxiety symptoms. Patient arrived within time allowed and reports that she is feeling "a lot of grief." Patient rates her mood at a "I don't know, very low" on a scale of 1-10 with 10 being great. Pt is visibly shaky and reports high anxiety due to the new environment and recent increase in symptomology. Pt states feeling high emotion regarding having to relate her background during intakes today. Pt states her evening was pleasant and she spent it with her son and daughter-in-law, who she is staying with. Patient minimally engaged in discussion.        Session Time: 11:00 -12:00   Participation Level: Minimal   Behavioral Response: CasualAlertDepressed   Type of Therapy: Group Therapy, OT   Treatment Goals addressed: Coping   Interventions: Psychosocial skills training, Supportive,    Summary:  Occupational Therapy group   Therapist Response: Patient engaged in group. See OT note.         Session Time: 12:00 - 12:45  Participation Level: Minimal  Behavioral Response: CasualAlertDepressed  Type of Therapy: Group Therapy, Activity Therapy  Treatment Goals addressed: Coping  Interventions: Systems analyst, Supportive  Summary:  Reflection Group: Patients encouraged to practice skills and interpersonal  techniques or work on mindfulness and relaxation techniques. The importance of self-care and making skills part of a routine to increase usage were stressed   Therapist Response: Patient engaged and participated appropriately.       Session Time: 12:45- 2:00  Participation Level: Minimal  Behavioral Response: CasualAlertDepressed  Type of Therapy: Group Therapy, Psychoeducation; Psychotherapy  Treatment Goals addressed: Coping  Interventions: CBT; Solution focused; Supportive; Reframing  Summary: 12:45 - 1:50: Clinician introduced topic of feelings and emotions. Clinician discussed the way to contextualize feelings as things that come and go and the ability to choose which feelings we attach to. Cln provided education on the difference between feelings and reactions to feelings, highlighting that our reactions we can alter. Clinician utilized hand out "Myths about Emotions" and pt's discussed ways in which the myths felts true and how to challenge them. 1:50 -2:00 Clinician led check-out. Clinician assessed for immediate needs, medication compliance and efficacy, and safety concerns   Therapist Response: Patient minimally engaged in discussion and continues to present noticeably shaky. Pt reports understanding of feelings and topics discussed.  [name], patient rates her mood at a 5 on a scale of 1-10 with 10 being great. Patient reports afternoon plans of having supper and visiting with her family. Patient demonstrates some progress as evidenced by participating in first group session. Patient denies SI/HI/self-harm at the end of group.       Suicidal/Homicidal: Nowithout intent/plan  Plan: Pt will continue in PHP while working to decrease depression symptoms, increase ability to manage symptoms as they arise, and increase stability.   Diagnosis: Severe episode of recurrent major depressive disorder, without psychotic features ([place]) [F33.2]    1. Severe episode of  recurrent major  depressive disorder, without psychotic features ([place])       Ashley Glass, LCSW 11/18/2018

## 2018-11-18 NOTE — Psych (Addendum)
   [facility] BH PHP THERAPIST PROGRESS NOTE  Ashley Padilla [id number]  Session Time: 9:00 - 10:00  Participation Level: Active  Behavioral Response: CasualAlertAnxious  Type of Therapy: Group Therapy  Treatment Goals addressed: Coping  Interventions: CBT, DBT, Solution Focused, Supportive and Reframing  Summary: Clinician led check-in regarding current stressors and situation, and review of patient completed daily inventory. Clinician utilized active listening and empathetic response and validated patient emotions. Clinician facilitated processing group on pertinent issues.   Therapist Response: Lorian Yaun is a 67 y.o. female who presents with depression and anxiety symptoms. Patient arrived within time allowed and reports that she "doesn't know how she feels". Patient rates her mood at a 7 on a scale of 1-10 with 10 being great. Pt reports that she met one of her goals by studying a second language. Pt states that she did yard work with son and it went well. Pt states that she had dinner with family and spoke with daughter on the phone. Patient continues to struggle with boundaries. Patient engaged in discussion.     Session Time: 10:00 - 11:00   Participation Level: Active   Behavioral Response: CasualAlertDepressed   Type of Therapy: Group Therapy, Psychotherapy   Treatment Goals addressed: Coping   Interventions: CBT, Solution focused, Supportive, Reframing   Summary:  Clinician continued topic of boundaries, completing review and then finishing boundaries worksheet with different ways boundary issues present.      Therapist Response: Pt reports understanding of boundaries and is able to participate in recall from yesterday.       Session Time: 11:00 -12:00   Participation Level: Active   Behavioral Response: CasualAlertDepressed   Type of Therapy: Group Therapy, OT   Treatment Goals addressed: Coping   Interventions: Psychosocial skills training, Supportive,     Summary:  Occupational Therapy group   Therapist Response: Patient engaged in group. See OT note.           Session Time: 12:00- 1:00   Participation Level: Active   Behavioral Response: CasualAlertDepressed   Type of Therapy: Group Therapy, Psychoeducation; Psychotherapy   Treatment Goals addressed: Coping   Interventions: CBT; Solution focused; Supportive; Reframing   Summary: 12:00 - 12:50 Group watched "100 days of Rejection" TedTalk and discussed the topic of rejection and how that plays out in our lives. 12:50 -1:00 Clinician led check-out. Clinician assessed for immediate needs, medication compliance and efficacy, and safety concerns     Therapist Response:  Pt engaged on discussion regarding rejection and is able to list at least one rejection from their life that they have overcome. At [place], patient rates her mood at a 7 on a scale of 1-10 with 10 being great. Patient reports weekend plans of spending time with daughter and fianc. Patient demonstrates some progress as evidenced by utilizing skills for self-care.Patient denies SI/HI/self-harm at the end of group.   Suicidal/Homicidal: Nowithout intent/plan  Plan: Pt will continue in PHP while working to decrease depression symptoms, increase ability to manage symptoms as they arise, and increase stability.   Diagnosis: Severe episode of recurrent major depressive disorder, without psychotic features ([place]) [F33.2]    1. Severe episode of recurrent major depressive disorder, without psychotic features ([place])     Makeila Yamaguchi J Kieanna Rollo, LPCA, LCASA 11/18/2018

## 2018-11-19 ENCOUNTER — Other Ambulatory Visit (HOSPITAL_COMMUNITY): Payer: Medicare Other | Admitting: Licensed Clinical Social Worker

## 2018-11-19 ENCOUNTER — Encounter (HOSPITAL_COMMUNITY): Payer: Self-pay | Admitting: Family

## 2018-11-19 VITALS — BP 128/84 | HR 70 | Ht 68.75 in | Wt 188.0 lb

## 2018-11-19 DIAGNOSIS — F332 Major depressive disorder, recurrent severe without psychotic features: Secondary | ICD-10-CM | POA: Diagnosis not present

## 2018-11-19 NOTE — Psych (Signed)
   [facility] BH PHP THERAPIST PROGRESS NOTE  Ashley Padilla [id number]  Session Time: 9:00 - 11:00  Participation Level: Active  Behavioral Response: CasualAlertAnxious  Type of Therapy: Group Therapy  Treatment Goals addressed: Coping  Interventions: CBT, DBT, Solution Focused, Supportive and Reframing  Summary: Clinician led check-in regarding current stressors and situation, and review of patient completed daily inventory. Clinician utilized active listening and empathetic response and validated patient emotions. Clinician facilitated processing group on pertinent issues.   Therapist Response: Ashley Padilla is a 67 y.o. female who presents with depression and anxiety symptoms. Patient arrived within time allowed and reports that she is feeling "anxious". Patient rates her mood at a 5 on a scale of 1-10 with 10 being great. Pt reports that she was able to dismiss thoughts that were causing anxiety. Pt states that she took a walk and spoke with friend. Pt. states that she ate with family and watched T.V. Patient continues to struggle with anxiety about the future. Patient engaged in discussion.      Session Time: 11:00 -12:00   Participation Level: Active   Behavioral Response: CasualAlertDepressed   Type of Therapy: Group Therapy, OT   Treatment Goals addressed: Coping   Interventions: Psychosocial skills training, Supportive,    Summary:  Occupational Therapy group   Therapist Response: Patient engaged in group. See OT note.         Session Time: 12:00 - 12:45  Participation Level: Active  Behavioral Response: CasualAlertDepressed  Type of Therapy: Group Therapy, Activity Therapy  Treatment Goals addressed: Coping  Interventions: Systems analyst, Supportive  Summary:  Reflection Group: Patients encouraged to practice skills and interpersonal techniques or work on mindfulness and relaxation techniques. The importance of self-care and making skills part of a  routine to increase usage were stressed   Therapist Response: Patient engaged and participated appropriately.       Session Time: 12:45- 2:00  Participation Level: Active  Behavioral Response: CasualAlertDepressed  Type of Therapy: Group Therapy, Psychoeducation; Psychotherapy  Treatment Goals addressed: Coping  Interventions: CBT; Solution focused; Supportive; Reframing  Summary: 12:45 - 1:50 Cln introduced topic of distress tolerance. Cln introduced ACCEPTS skills. Group discussed "A-C-C-E-P" and how they can utilize the skills in the present moment.    1:50 -2:00 Clinician led check-out. Clinician assessed for immediate needs, medication compliance and efficacy, and safety concerns   Therapist Response: Patient engaged in group.  At [place], patient rates her mood at a 5 on a scale of 1-10 with 10 being great. Patient reports afternoon plans of going home, watching T.V, and walking. Patient demonstrates some progress as evidenced by utilizing skills to manage difficult thoughts. Patient denies SI/HI/self-harm thoughts at the end of group.     Suicidal/Homicidal: Nowithout intent/plan  Plan: Pt will continue in PHP while working to decrease depression symptoms, increase ability to manage symptoms as they arise, and increase stability.   Diagnosis: Severe episode of recurrent major depressive disorder, without psychotic features ([place]) [F33.2]    1. Severe episode of recurrent major depressive disorder, without psychotic features ([place])       [name] [name], LPCA, LCASA 11/19/2018

## 2018-11-19 NOTE — Psych (Signed)
[facility] BH PHP THERAPIST PROGRESS NOTE  Ashley Padilla [id number]  Session Time: 9:00 - 11:00  Participation Level: Active  Behavioral Response: CasualAlertAnxious  Type of Therapy: Group Therapy  Treatment Goals addressed: Coping  Interventions: CBT, DBT, Solution Focused, Supportive and Reframing  Summary: Clinician led check-in regarding current stressors and situation, and review of patient completed daily inventory. Clinician utilized active listening and empathetic response and validated patient emotions. Clinician facilitated processing group on pertinent issues.   Therapist Response: Ashley Padilla is a 67 y.o. female who presents with depression and anxiety symptoms. Patient arrived within time allowed and reports that she is feeling "okay" Patient rates her mood at a 5 on a scale of 1-10 with 10 being great. Pt reports she drove for the first time since her attempt today and felt like it went well and she is confident about driving again. Pt reports her evening was pleasant and she walked and spent time with her son. Pt states she is working hard to stay in "here and now thinking" because she does not feel prepared to look at bigger issues. Patient engaged in discussion.        Session Time: 11:00 - 12:00   Participation Level:Active  Behavioral Response:CasualAlertDepressed  Type of Therapy: Group Therapy  Treatment Goals addressed: Coping  Interventions:CBT; Solution focused; Supportive; Reframing  Summary:Cln introduced topic of the holidays and how group members plan to recognize and spend Thanksgiving. Group discussed concerns about the holiday weekend and planned ways to problem solve should the concerns occur.  Therapist Response: Patient engaged in discussion. Pt states that she will be with her son and daughter-in-law for Thanksgiving and states having quiet plans. Pt reports no concerns about the holiday specifically.       Session Time:  12:00 - 12:45  Participation Level: Active  Behavioral Response: CasualAlertDepressed  Type of Therapy: Group Therapy, Activity Therapy  Treatment Goals addressed: Coping  Interventions: Systems analyst, Supportive  Summary:  Reflection Group: Patients encouraged to practice skills and interpersonal techniques or work on mindfulness and relaxation techniques. The importance of self-care and making skills part of a routine to increase usage were stressed   Therapist Response: Patient engaged and participated appropriately.       Session Time: 12:45- 2:00  Participation Level: Active  Behavioral Response: CasualAlertDepressed  Type of Therapy: Group Therapy, Psychoeducation; Psychotherapy  Treatment Goals addressed: Coping  Interventions: CBT; Solution focused; Supportive; Reframing  Summary: 12:45 - 1:50: Clinician introduced topic of "Positive Psychology." Group watched "The Happiness Advantage" TED talk and discussed how the "lens" through which they view life affects the way they feel. Pts identified a strategy they would be willing to try to change their "lens."  1:50 -2:00 Clinician led check-out. Clinician assessed for immediate needs, medication compliance and efficacy, and safety concerns   Therapist Response: Pt engaged in discussion regarding ways to train your mind to scan for the positive. Pt reports willingness to try daily gratitudes as a way to practice.   At [place], patient rates her mood at a 5 on a scale of 1-10 with 10 being great. Patient reports plans spending time with familyo ver the holiday weekend. Patient demonstrates some progress as evidenced by increased verbalization in group. Patient denies SI/HI/self-harm at the end of group.     Suicidal/Homicidal: Nowithout intent/plan  Plan: Pt will continue in PHP while working to decrease depression symptoms, increase ability to manage symptoms as they arise, and increase  stability.   Diagnosis:  Severe episode of recurrent major depressive disorder, without psychotic features ([place]) [F33.2]    1. Severe episode of recurrent major depressive disorder, without psychotic features ([place])       Lorin Glass, LCSW 11/19/2018

## 2018-11-19 NOTE — Psych (Signed)
   [facility] BH PHP THERAPIST PROGRESS NOTE  Arine Foley [id number]  Session Time: 9:00 - 11:00  Participation Level: Active  Behavioral Response: CasualAlertAnxious  Type of Therapy: Group Therapy  Treatment Goals addressed: Coping  Interventions: CBT, DBT, Solution Focused, Supportive and Reframing  Summary: Clinician led check-in regarding current stressors and situation, and review of patient completed daily inventory. Clinician utilized active listening and empathetic response and validated patient emotions. Clinician facilitated processing group on pertinent issues.   Therapist Response: Jamiesha Victoria is a 67 y.o. female who presents with depression and anxiety symptoms. Patient arrived within time allowed and reports that she is feeling "good." Patient rates her mood at a "6" on a scale of 1-10 with 10 being great. Pt reports her holiday was good and spent time with her family. Pt reports she is feeling less anxious today than last week. Pt continue to struggle with making decisions. Patient engaged in discussion.        Session Time: 11:00 -12:00   Participation Level: Active   Behavioral Response: CasualAlertDepressed   Type of Therapy: Group Therapy, OT   Treatment Goals addressed: Coping   Interventions: Psychosocial skills training, Supportive,    Summary:  Occupational Therapy group   Therapist Response: Patient engaged in group. See OT note.         Session Time: 12:00 - 12:45  Participation Level: Active  Behavioral Response: CasualAlertDepressed  Type of Therapy: Group Therapy, Activity Therapy  Treatment Goals addressed: Coping  Interventions: Systems analyst, Supportive  Summary:  Reflection Group: Patients encouraged to practice skills and interpersonal techniques or work on mindfulness and relaxation techniques. The importance of self-care and making skills part of a routine to increase usage were stressed   Therapist Response: Patient  engaged and participated appropriately.       Session Time: 12:45- 2:00  Participation Level: Active  Behavioral Response: CasualAlertDepressed  Type of Therapy: Group Therapy, Psychoeducation; Psychotherapy  Treatment Goals addressed: Coping  Interventions: CBT; Solution focused; Supportive; Reframing  Summary: 12:45 - 1:50: Clinician led group on The Five Love Languages and how they can aid relationships. Group members discussed the importance of each language and took the [employer] quiz. Cln discussed how looking at love languages can counteract unhealthy thought patterns and improve interpersonal relationships. 1:50 -2:00 Clinician led check-out. Clinician assessed for immediate needs, medication compliance and efficacy, and safety concerns  Therapist Response: Patient engaged in activity and discussion. Patient identified her love language as quality time and is going to work on spending one time with herself and communicating with her friends how she feels love with quality time.  At [place], patient rates her mood at a 7 on a scale of 1-10 with 10 being great. Patient reports afternoon plans of walking and spending time with family. Patient demonstrates some progress as evidenced by improved mood. Patient denies SI/HI/self-harm at the end of group.   Suicidal/Homicidal: Nowithout intent/plan  Plan: Pt will continue in PHP while working to decrease depression symptoms, increase ability to manage symptoms as they arise, and increase stability.   Diagnosis: Severe episode of recurrent major depressive disorder, without psychotic features ([place]) [F33.2]    1. Severe episode of recurrent major depressive disorder, without psychotic features ([place])       Denisse Whitenack J Trystian Crisanto, LPCA, LCASA 11/19/2018

## 2018-11-19 NOTE — Psych (Addendum)
   [facility] BH PHP THERAPIST PROGRESS NOTE  Tikita Mabee [id number]  Session Time: 9:00 - 11:00  Participation Level: Active  Behavioral Response: CasualAlertAnxious  Type of Therapy: Group Therapy  Treatment Goals addressed: Coping  Interventions: CBT, DBT, Solution Focused, Supportive and Reframing  Summary: Clinician led check-in regarding current stressors and situation, and review of patient completed daily inventory. Clinician utilized active listening and empathetic response and validated patient emotions. Clinician facilitated processing group on pertinent issues.   Therapist Response: Beretta Ginsberg is a 67 y.o. female who presents with depression and anxiety symptoms. Patient arrived within time allowed and reports that she "feels pretty good today." Patient rates her mood at a 7 on a scale of 1-10 with 10 being great. Pt reports that had a nice weekend with her daughter. Pt spent time with her daughter and fianc and hung out at home. Pt states she cooked dinner. Patient continues to struggle with self-esteem. Patient engaged in discussion.    Session Time: 11:00 -12:00   Participation Level: Active   Behavioral Response: CasualAlertDepressed   Type of Therapy: Group Therapy, OT   Treatment Goals addressed: Coping   Interventions: Psychosocial skills training, Supportive,    Summary:  Occupational Therapy group   Therapist Response: Patient engaged in group. See OT note.         Session Time: 12:00 - 12:45  Participation Level: Active  Behavioral Response: CasualAlertDepressed  Type of Therapy: Group Therapy, Activity Therapy  Treatment Goals addressed: Coping  Interventions: Systems analyst, Supportive  Summary:  Reflection Group: Patients encouraged to practice skills and interpersonal techniques or work on mindfulness and relaxation techniques. The importance of self-care and making skills part of a routine to increase usage were stressed    Therapist Response: Patient engaged and participated appropriately.       Session Time: 12:45- 2:00  Participation Level: Active  Behavioral Response: CasualAlertDepressed  Type of Therapy: Group Therapy, Psychoeducation; Psychotherapy  Treatment Goals addressed: Coping  Interventions: CBT; Solution focused; Supportive; Reframing  Summary: 12:45 - 1:50: Cln discussed how to set and maintain boundaries. Group members discussed boundary issues from their lives and group provided feedback and problem solving based on boundary education.  1:50 -2:00 Clinician led check-out. Clinician assessed for immediate needs, medication compliance and efficacy, and safety concerns    Therapist Response: Patient engaged in group.  At [place], patient rates her mood at a 7 on a scale of 1-10 with 10 being great. Patient reports afternoon plans to take a walk and watch T.V. Patient demonstrates some progress as evidenced by utilizing skills to reframe thoughts of guilt. Patient denies SI/HI/self-harm thoughts at the end of group.   Suicidal/Homicidal: Nowithout intent/plan  Plan: Pt will continue in PHP while working to decrease depression symptoms, increase ability to manage symptoms as they arise, and increase stability.   Diagnosis: Severe episode of recurrent major depressive disorder, without psychotic features ([place]) [F33.2]    1. Severe episode of recurrent major depressive disorder, without psychotic features ([place])       Shandra Szymborski J Savva Beamer, LPCA, LCASA 11/19/2018

## 2018-11-19 NOTE — Progress Notes (Signed)
Patient presented with brighter affect, more level mood and denied any current suicidal or homicidal ideations, no auditory or visual hallucinations and no plan or intent to want to harm self or others.  Patient rated her current level of depression a 2, anxiety a 4-5, and hopelessness a 4-5 with 0 being none and 10 the worst she could manage.  Patient stated "every day a little more hopeful" and reports she feels she must need to still be here and does not desire to want to harm self again.  States she plans to finish out PHP this week and to then move to IOP for the next 2 weeks.  Patient reports her anxiety is still not being sure about the future but is working with her family and taking things one day at a time until she returns home.  Patient reported no problems with current medications and now sleeping 8-9 hours a night, up once or twice but reports this is typical for her as well.  States appetite fine and is doing fine with still staying with her son and daughter-in-law here in [place]. Patient reports no racing thoughts and no religious preoccupation or paranoia noted as was when patient began PHP.  Plans to finish PHP and start IOP on Monday of the coming week.  Agreed to inform this nurse or Ricky Ala, NP if any problems with transition to IOP.

## 2018-11-20 ENCOUNTER — Encounter (HOSPITAL_COMMUNITY): Payer: Self-pay | Admitting: Occupational Therapy

## 2018-11-20 ENCOUNTER — Other Ambulatory Visit (HOSPITAL_COMMUNITY): Payer: Medicare Other | Admitting: Licensed Clinical Social Worker

## 2018-11-20 ENCOUNTER — Other Ambulatory Visit (HOSPITAL_COMMUNITY): Payer: Medicare Other | Admitting: Occupational Therapy

## 2018-11-20 DIAGNOSIS — R4589 Other symptoms and signs involving emotional state: Secondary | ICD-10-CM

## 2018-11-20 DIAGNOSIS — F332 Major depressive disorder, recurrent severe without psychotic features: Secondary | ICD-10-CM

## 2018-11-20 DIAGNOSIS — F070 Personality change due to known physiological condition: Secondary | ICD-10-CM

## 2018-11-20 NOTE — Therapy (Signed)
[place], [place], [address] Phone: [phone]   Fax:  [phone]  Occupational Therapy Treatment  Patient Details  Name: Ashley Padilla MRN: [id number] Date of Birth: 20-Jun-1951 Referring Provider (OT): Ricky Ala, NP   Encounter Date: 11/20/2018  OT End of Session - 11/20/18 1345    Visit Number  8    Number of Visits  16    Date for OT Re-Evaluation  12/02/18    Authorization Type  [employer]    OT Start Time  1100    OT Stop Time  1200    OT Time Calculation (min)  60 min    Activity Tolerance  Patient tolerated treatment well    Behavior During Therapy  [facility] for tasks assessed/performed       Past Medical History:  Diagnosis Date  . Hypertension 2016    Past Surgical History:  Procedure Laterality Date  . COLON SURGERY  2010   Colon Resection  . COLON SURGERY  2011   Ostomy reversal     There were no vitals filed for this visit.  Subjective Assessment - 11/20/18 1345    Currently in Pain?  No/denies        S: "I struggle because my friends are in [place], but I have not connected with them or called them"   O:Eduation given on importance of social participation when reintegrating into community. Further education given on varying types of social participation (emotional, tangible, informational, and social needs), how they can be of use, the barriers, and how to apply them to current problems. Additional information given on community options for socially engaging leisure activities. Pt to choose one area this date that will help improve social participation.   A: Pt presents to group with appropraite affect, engaged and participatory throughout session. Pt sharing that family, friends, and PHP are her forms of social support. Her barriers are the distance (her friends are in [place]) and that [place] is temporary- considering how she will cope and provide social support for herself once  d/cing from group. Pt goal this date is to call her friend in [place] and catch up on the previous month.  P: OT will continue to follow up on social participation information for increased implementation into daily routine.                    OT Education - 11/20/18 1345    Education Details  education given on social support and participation in the community    Person(s) Educated  Patient    Methods  Explanation;Handout    Comprehension  Verbalized understanding       OT Short Term Goals - 11/11/18 0824      OT SHORT TERM GOAL #1   Title  Pt will be educated on strategies to improve psychosocial skills needed to participate fully in all daily, work, and leisure activities    Time  4    Period  Weeks    Status  On-going    Target Date  12/02/18      OT SHORT TERM GOAL #2   Title  Pt will apply psychosocial skills and coping mechanisms to daily activities in order to function independently and reintegrate into community dwelling    Time  4    Period  Weeks    Status  On-going    Target Date  12/02/18      OT SHORT TERM GOAL #  3   Title  Pt will recall and/or apply 1-3 sleep hygiene strategies to improve engagement in BADL routine    Time  4    Period  Weeks    Status  On-going    Target Date  12/02/18      OT SHORT TERM GOAL #4   Title  Pt will engage in goal setting to improve functional BADL/IADL routine upon reintegrating into community    Time  4    Period  Weeks    Status  On-going    Target Date  12/02/18               Plan - 11/20/18 1346    Occupational performance deficits (Please refer to evaluation for details):  ADL's;IADL's;Rest and Sleep;Leisure;Social Participation       Patient will benefit from skilled therapeutic intervention in order to improve the following deficits and impairments:  Decreased coping skills, Decreased psychosocial skills, Other (comment)(decreased ability to engage in BADL and reintegrate into  community)  Visit Diagnosis: Organic personality disorder  Difficulty coping    Problem List There are no active problems to display for this patient.  Zenovia Jarred, MSOT, OTR/L Behavioral Health OT/ Acute Relief OT PHP Office: [place] 11/20/2018, 1:47 PM  [facility] PARTIAL HOSPITALIZATION PROGRAM [place], [place], [address] Phone: [phone]   Fax:  [phone]  Name: Ashley Padilla MRN: [id number] Date of Birth: [birth year]

## 2018-11-21 ENCOUNTER — Telehealth (HOSPITAL_COMMUNITY): Payer: Self-pay | Admitting: Licensed Clinical Social Worker

## 2018-11-21 ENCOUNTER — Other Ambulatory Visit (HOSPITAL_COMMUNITY): Payer: Medicare Other | Admitting: Licensed Clinical Social Worker

## 2018-11-21 ENCOUNTER — Encounter (HOSPITAL_COMMUNITY): Payer: Self-pay | Admitting: Occupational Therapy

## 2018-11-21 ENCOUNTER — Other Ambulatory Visit (HOSPITAL_COMMUNITY): Payer: Medicare Other | Admitting: Occupational Therapy

## 2018-11-21 DIAGNOSIS — F070 Personality change due to known physiological condition: Secondary | ICD-10-CM

## 2018-11-21 DIAGNOSIS — F332 Major depressive disorder, recurrent severe without psychotic features: Secondary | ICD-10-CM | POA: Diagnosis not present

## 2018-11-21 DIAGNOSIS — R4589 Other symptoms and signs involving emotional state: Secondary | ICD-10-CM

## 2018-11-21 NOTE — Therapy (Signed)
[place], [place], [address] Phone: [phone]   Fax:  [phone]  Occupational Therapy Treatment  Patient Details  Name: Ashley Padilla MRN: [id number] Date of Birth: Oct 06, 1951 Referring Provider (OT): Ricky Ala, NP   Encounter Date: 11/21/2018  OT End of Session - 11/21/18 1251    Visit Number  9    Number of Visits  16    Date for OT Re-Evaluation  12/02/18    Authorization Type  [employer]    OT Start Time  1100    OT Stop Time  1200    OT Time Calculation (min)  60 min    Activity Tolerance  Patient tolerated treatment well    Behavior During Therapy  [facility] for tasks assessed/performed       Past Medical History:  Diagnosis Date  . Hypertension 2016    Past Surgical History:  Procedure Laterality Date  . COLON SURGERY  2010   Colon Resection  . COLON SURGERY  2011   Ostomy reversal     There were no vitals filed for this visit.  Subjective Assessment - 11/21/18 1251    Currently in Pain?  No/denies        S: "I think I would improve a lot if I continue to increase my self esteem"  O: Education given on protective factors and their importance in building resiliency to face difficult life challenges. Protective factors worksheet completed. Pt to rate current protective factors of social support, coping skills, physical health, sense of purpose, self-esteem, and healthy thinking on a scale from weak-moderate-strong. Pt then to identify the most valuable protective factor, 2 protective factors to improve, and specific goals to accomplish this task. Pt then engaged in art based activity of making "your mask" vs "you without your mask"- pt to share at end of session.  A: Pt presents to group with appropriate affect, engaged and offering examples with other group members and facilitator. Pt completed protective factors worksheet, identifying that she scores weaker on healthy thinking. She  shares that coping skills have helped her in the past to manage depression throughout her life thus far. Pt completing art activity, sharing that her mask depicts that she is "fine" or "life is going great", but on the inside is still struggling with depression at this time.  P: Education given on protective factors and goal setting using art as therapeutic tool. OT will continue follow up with pt to ensure successful implementation in daily life.                      OT Education - 11/21/18 1251    Education Details  education given on protective factors    Person(s) Educated  Patient    Methods  Explanation;Handout    Comprehension  Verbalized understanding       OT Short Term Goals - 11/21/18 1252      OT SHORT TERM GOAL #1   Title  Pt will be educated on strategies to improve psychosocial skills needed to participate fully in all daily, work, and leisure activities    Time  4    Period  Weeks    Status  Achieved    Target Date  12/02/18      OT SHORT TERM GOAL #2   Title  Pt will apply psychosocial skills and coping mechanisms to daily activities in order to function independently and reintegrate into community dwelling  Time  4    Period  Weeks    Status  Achieved    Target Date  12/02/18      OT SHORT TERM GOAL #3   Title  Pt will recall and/or apply 1-3 sleep hygiene strategies to improve engagement in BADL routine    Time  4    Period  Weeks    Status  Achieved    Target Date  12/02/18      OT SHORT TERM GOAL #4   Title  Pt will engage in goal setting to improve functional BADL/IADL routine upon reintegrating into community    Time  4    Period  Weeks    Status  Achieved    Target Date  12/02/18               Plan - 11/21/18 1251    Occupational performance deficits (Please refer to evaluation for details):  ADL's;IADL's;Rest and Sleep;Leisure;Social Participation       Patient will benefit from skilled therapeutic intervention in  order to improve the following deficits and impairments:  Decreased coping skills, Decreased psychosocial skills, Other (comment)(decreased ability to engage in BADL and reintegrate into community)  Visit Diagnosis: Organic personality disorder  Difficulty coping    Problem List There are no active problems to display for this patient.  OCCUPATIONAL THERAPY DISCHARGE SUMMARY  Visits from Start of Care: 9  Current functional level related to goals / functional outcomes: Pt stepping down to IOP level of care   Remaining deficits: Continuing to implement coping skills   Education / Equipment: Education given on psychosocial skills and coping mechanisms as it applies to BADL/IADL routine for increased functional engagement in community upon d/c. Plan: Patient agrees to discharge.  Patient goals were met. Patient is being discharged due to meeting the stated rehab goals.  ?????        Zenovia Jarred, MSOT, OTR/L Behavioral Health OT/ Acute Relief OT PHP Office: [place] 11/21/2018, 12:53 PM  [facility] HOSPITALIZATION PROGRAM [place], [place], [address] Phone: [phone]   Fax:  [phone]  Name: Ashley Padilla MRN: [id number] Date of Birth: [birth year]

## 2018-11-21 NOTE — Psych (Signed)
[facility] BH PHP THERAPIST PROGRESS NOTE  Ashley Padilla [id number]  Session Time: 9:00 - 11:00  Participation Level: Active  Behavioral Response: CasualAlertAnxious  Type of Therapy: Group Therapy  Treatment Goals addressed: Coping  Interventions: CBT, DBT, Solution Focused, Supportive and Reframing  Summary: Clinician led check-in regarding current stressors and situation, and review of patient completed daily inventory. Clinician utilized active listening and empathetic response and validated patient emotions. Clinician facilitated processing group on pertinent issues.   Therapist Response: Ashley Padilla is a 67 y.o. female who presents with depression and anxiety symptoms. Patient arrived within time allowed and reports that she is feeling "a little anxious." Patient rates her mood at a "5" on a scale of 1-10 with 10 being great. Pt states that changes makes her anxious and her daughter-in-law will be out of town for the next 3 days and pt is uneasy about how it will affect her day-to-day.  Pt states that she and her son have discussed a plan for the afternoons/evenings and she is comfortable with the plans, but remains nervous about increased alone time. Pt states her afternoon went well and she continues to walk on days the weather is good and she talked to her daughter on the phone. Pt is reporting not wanting a family session at this point because she is open with them about what is going on and does not want to "burden" them with having to alter schedules to come in. Patient engaged in discussion.        Session Time: 11:00 -12:00   Participation Level: Active   Behavioral Response: CasualAlertDepressed   Type of Therapy: Group Therapy, OT   Treatment Goals addressed: Coping   Interventions: Psychosocial skills training, Supportive,    Summary:  Occupational Therapy group   Therapist Response: Patient engaged in group. See OT note.         Session Time: 12:00 -  12:45  Participation Level: Active  Behavioral Response: CasualAlertDepressed  Type of Therapy: Group Therapy, Activity Therapy  Treatment Goals addressed: Coping  Interventions: Systems analyst, Supportive  Summary:  Reflection Group: Patients encouraged to practice skills and interpersonal techniques or work on mindfulness and relaxation techniques. The importance of self-care and making skills part of a routine to increase usage were stressed   Therapist Response: Patient engaged and participated appropriately.        Session Time: 12:45- 2:00  Participation Level: Active  Behavioral Response: CasualAlertDepressed  Type of Therapy: Group Therapy, Psychoeducation; Psychotherapy  Treatment Goals addressed: Coping  Interventions: CBT; Solution focused; Supportive; Reframing  Summary: 1:00 - 1:50: Clinician introduced topic of self esteem. Cln provided psycho-education on the roots of healthy/unhealthy self esteem. Group reviewed CBT based model for altering core beliefs and addressing the maladaptive rules stemming from low self esteem. Group completed examples from both models.  1:50 -2:00 Clinician led check-out. Clinician assessed for immediate needs, medication compliance and efficacy, and safety concerns   Therapist Response: Patient engaged in activity and discussion. Pt identifies their self esteem as low currently. Pt completed examples for altering core beliefs and maladaptive behaviors and chose not to share with the group.  At [place], patient rates her mood at a 7 on a scale of 1-10 with 10 being great. Patient reports afternoon plans of picking up her prescriptions, walking, and spending the evening with her son.  Patient demonstrates some progress as evidenced by being able to rationalize with her anxiety this morning when concerned about changes. Patient denies  SI/HI/self-harm at the end of group.       Suicidal/Homicidal: Nowithout  intent/plan  Plan: Pt will continue in PHP while working to decrease depression symptoms, increase ability to manage symptoms as they arise, and increase stability.   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 Severe episode of recurrent major depressive disorder, without psychotic features ([place])   2. Difficulty coping       Lorin Glass, LCSW, LCAS 11/21/2018
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
## 2018-11-21 NOTE — Psych (Signed)
   [facility] BH PHP THERAPIST PROGRESS NOTE  Ashley Padilla [id number]  Session Time: 9:00 - 11:00  Participation Level: Active  Behavioral Response: CasualAlertAnxious  Type of Therapy: Group Therapy  Treatment Goals addressed: Coping  Interventions: CBT, DBT, Solution Focused, Supportive and Reframing  Summary: Clinician led check-in regarding current stressors and situation, and review of patient completed daily inventory. Clinician utilized active listening and empathetic response and validated patient emotions. Clinician facilitated processing group on pertinent issues.   Therapist Response: [name] is a 67 y.o. female who presents with depression and anxiety symptoms. Patient arrived within time allowed and reports that she is "having a hard time with decision making". Patient rates her mood at a 6 on a scale of 1-10 with 10 being great. Pt states that she spoke with children about future and felt better about choices. Pt states that she made some calls and had dinner with family. Patient continues to struggle with anxiety about making "right" decision. Patient engaged in discussion.   Session Time: 11:00 -12:15   Participation Level: Active   Behavioral Response: CasualAlertDepressed   Type of Therapy: Group Therapy, psychotherapy   Treatment Goals addressed: Coping   Interventions: Strengths based, reframing, Supportive,    Summary:  Spiritual Care group   Therapist Response: Patient engaged in group. See chaplain note.               Session Time: 12:15 - 1:00   Participation Level: Active   Behavioral Response: CasualAlertDepressed   Type of Therapy: Group Therapy   Treatment Goals addressed: Coping   Interventions: Systems analyst, Supportive   Summary:  Reflection Group: Patients encouraged to practice skills and interpersonal techniques or work on mindfulness and relaxation techniques. The importance of self-care and making skills part of a routine  to increase usage were stressed    Therapist Response: Patient engaged and participated appropriately.              Session Time: 1:00- 2:00   Participation Level: Active   Behavioral Response: CasualAlertDepressed   Type of Therapy: Group Therapy, Psychoeducation   Treatment Goals addressed: Coping   Interventions: relaxation training; Supportive; Reframing   Summary: 12:45 - 1:50: Relaxation group: Cln led group focused on retraining the body's response to stress.   1:50 -2:00 Clinician led check-out. Clinician assessed for immediate needs, medication compliance and efficacy, and safety concerns  Therapist Response: Patient engaged in group.  At [place], patient rates her mood at a 7 on a scale of 1-10 with 10 being great. Patient reports afternoon plans of going shopping and cooking dinner. Patient demonstrates some progress as evidenced by continued use of skills to work on developing healthy decisions making options including pros and cons lists. Patient denies SI/HI/self-harm thoughts at the end of group.    Suicidal/Homicidal: Nowithout intent/plan  Plan: Pt will continue in PHP while working to decrease depression symptoms, increase ability to manage symptoms as they arise, and increase stability.   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 Severe episode of recurrent major depressive disorder, without psychotic features ([place])       Harlan Ervine J Isaiah Torok, LPCA, LCASA 11/21/2018

## 2018-11-22 ENCOUNTER — Encounter (HOSPITAL_COMMUNITY): Payer: Self-pay | Admitting: Family

## 2018-11-24 ENCOUNTER — Ambulatory Visit (HOSPITAL_COMMUNITY): Payer: BLUE CROSS/BLUE SHIELD

## 2018-11-24 ENCOUNTER — Telehealth (HOSPITAL_COMMUNITY): Payer: Self-pay | Admitting: Psychiatry

## 2018-11-24 ENCOUNTER — Other Ambulatory Visit (HOSPITAL_COMMUNITY): Payer: BLUE CROSS/BLUE SHIELD

## 2018-11-24 NOTE — Telephone Encounter (Signed)
D:  Pt was referred to MH-IOP from [facility].  Pt will start IOP tomorrow.  A:  Placed call to pt to orient and answer questions; but there was no answer.  Left vm with name and phone number.

## 2018-11-25 ENCOUNTER — Ambulatory Visit (HOSPITAL_COMMUNITY): Payer: BLUE CROSS/BLUE SHIELD

## 2018-11-25 ENCOUNTER — Encounter (HOSPITAL_COMMUNITY): Payer: Self-pay | Admitting: Psychiatry

## 2018-11-25 ENCOUNTER — Other Ambulatory Visit (HOSPITAL_COMMUNITY): Payer: BLUE CROSS/BLUE SHIELD

## 2018-11-25 ENCOUNTER — Other Ambulatory Visit (HOSPITAL_COMMUNITY): Payer: Medicare Other | Admitting: Family

## 2018-11-25 DIAGNOSIS — F332 Major depressive disorder, recurrent severe without psychotic features: Secondary | ICD-10-CM | POA: Diagnosis not present
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
## 2018-11-25 NOTE — Progress Notes (Signed)
Psychiatric Initial Adult Assessment   Ashley Padilla Identification: Ashley Padilla MRN:  [id number] Date of Evaluation:  11/28/2018 Referral Source: PHP Chief Complaint:   Chief Complaint    Depression; Anxiety     Visit Diagnosis:    ICD-10-CM   1. Severe episode of recurrent major depressive disorder, without psychotic features ([place]) F33.2     History of Present Illness:  Ashley Padilla awake, alert and oriented x3.  Ashley Padilla recent transition from Partial Hospitalization Program (PHP) into  Intensive Outpatient program (IOP).  As she reports she is feeling better since her admission. Reports she is less tearful and feels as if her mood as improved. Ashley Padilla reports feeling better overall.  Ashley Padilla continues to express fear and symptoms of worry related to future encounters and life after daily group sessions. reports the group structures have been helpful.  Ashley Padilla reports struggling with feelings of guilt with causing her children to worry and now as if she is a burden, that she has to reside with them during her treatment. Reports " I know they have my best interest at heart"  "  I should be caring for them." Ashley Padilla reports her family has been supportive during recent hospitalizations and outpatient programing.   Ashley Padilla is prescribed is prescribed Zoloft 100 mg and Seroquel 75 mg. Reports taken and tolerating well.   Rates her depression at 3 out of 10 with 10 being the worst.  Ashley Padilla does admit to intermittent intrusive thoughts, however denies plan or intent. Ashley Padilla admitted to IOP on 11/25/2018.  For further advancement and application of skills acquired doing partial hospitalization program.  Per assessment note from [facility] note:Ashley Padilla was admitted at the hospital in [place], [place] from 10/23/18 - 10/28/18 due to a suicide attempt and continued SI. 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 Ashley Padilla states SI began approximately 2 weeks prior to her hospitalization due to an encounter from an old co-worker. Ashley Padilla reports this is a man that she was attracted to when they worked together, for which she feels very guilty because she was married. Ashley Padilla states the man shook her hand when they ran into her and that sent her into a spiral. Ashley Padilla is unable to clearly say what it was about the interaction that undid her, but does share feelings of guilt and fearing she would bring "spiritual harm" to her children. Ashley Padilla is unable to define what "spiritual harm" meant or why she had that concern. Ashley Padilla does state "I struggle with my salvation and I think that's the root of all this." Ashley Padilla reports conflict with thoughts that she considers "sinful" and forgiveness   Associated Signs/Symptoms: Depression Symptoms:  depressed mood, feelings of worthlessness/guilt, difficulty concentrating, anxiety, (Hypo) Manic Symptoms:  Distractibility, Irritable Mood, Labiality of Mood, has improved since her admission to PHP Anxiety Symptoms:  Excessive Worry, Psychotic Symptoms:  Hallucinations: None Paranoia, PTSD Symptoms: NA  Past Psychiatric History: Previous inpatient admission for suicidal attempt, partial hospitalization program.  Currently enrolled in intensive outpatient program.   Previous Psychotropic Medications: No   Substance Abuse History in the last 12 months:  No.  Consequences of Substance Abuse: NA  Past Medical History:  Past Medical History:  Diagnosis Date  . Anxiety   . Depression   . Hypertension 2016    Past Surgical History:  Procedure Laterality Date  . COLON SURGERY  2010   Colon  Resection  . COLON SURGERY  2011   Ostomy reversal     Family Psychiatric History:  Family History:  Family History  Problem Relation Age of Onset  . Anxiety disorder Mother   . Depression Mother   . Alcohol abuse Father   . Anxiety disorder Sister   . Depression Sister    . Anxiety disorder Maternal Grandmother   . Depression Maternal Grandmother     Social History:   Social History   Socioeconomic History  . Marital status: Unknown    Spouse name: Not on file  . Number of children: Not on file  . Years of education: Not on file  . Highest education level: Not on file  Occupational History  . Not on file  Social Needs  . Financial resource strain: Not very hard  . Food insecurity:    Worry: Never true    Inability: Never true  . Transportation needs:    Medical: No    Non-medical: No  Tobacco Use  . Smoking status: Never Smoker  . Smokeless tobacco: Never Used  Substance and Sexual Activity  . Alcohol use: Not Currently  . Drug use: Not Currently  . Sexual activity: Not Currently  Lifestyle  . Physical activity:    Days per week: 7 days    Minutes per session: 40 min  . Stress: Very much  Relationships  . Social connections:    Talks on phone: More than three times a week    Gets together: Once a week    Attends religious service: Never    Active member of club or organization: No    Attends meetings of clubs or organizations: Never    Relationship status: Widowed  Other Topics Concern  . Not on file  Social History Narrative  . Not on file    Additional Social History:   Allergies:   Allergies  Allergen Reactions  . Penicillins Other (See Comments)    Reports had a reaction as a baby but not sure what occurred.   . Adhesive [Tape] Rash    Metabolic Disorder Labs: No results found for: HGBA1C, MPG No results found for: PROLACTIN No results found for: CHOL, TRIG, HDL, CHOLHDL, VLDL, LDLCALC No results found for: TSH  Therapeutic Level Labs: No results found for: LITHIUM No results found for: CBMZ No results found for: VALPROATE  Current Medications: Current Outpatient Medications  Medication Sig Dispense Refill  . lisinopril (PRINIVIL,ZESTRIL) 20 MG tablet Take 20 mg by mouth daily.    . QUEtiapine (SEROQUEL)  25 MG tablet Take 3 tablets (75 mg total) by mouth at bedtime. 90 tablet 0  . sertraline (ZOLOFT) 100 MG tablet Take 1 tablet (100 mg total) by mouth daily. 60 tablet 0   No current facility-administered medications for this visit.     Musculoskeletal: Strength & Muscle Tone: within normal limits Gait & Station: normal Ashley Padilla leans: N/A  Psychiatric Specialty Exam: Review of Systems  Psychiatric/Behavioral: Positive for depression (improving ). Negative for suicidal ideas. The Ashley Padilla is nervous/anxious.   All other systems reviewed and are negative.   There were no vitals taken for this visit.There is no height or weight on file to calculate BMI.  General Appearance: Casual  Eye Contact:  Good  Speech:  Clear and Coherent  Volume:  Normal  Mood:  Anxious and Depressed  Affect:  Congruent  Thought Process:  Coherent  Orientation:  Full (Time, Place, and Person)  Thought Content:  WDL  Suicidal  Thoughts:  No  Homicidal Thoughts:  No  Memory:  Immediate;   Fair Recent;   Fair Remote;   Fair  Judgement:  Fair  Insight:  Fair  Psychomotor Activity:  Normal  Concentration:  Concentration: Fair  Recall:  [employer] [place]: Fair  Akathisia:  No  Handed:  Right  AIMS (if indicated):    Assets:  Communication Skills Desire for Improvement Resilience Social Support  ADL's:  Intact  Cognition: WNL  Sleep:  Fair   Screenings: GAD-7     Counselor from 11/21/2018 in [facility] Counselor from 11/05/2018 in [place]  Total GAD-7 Score  6  13    PHQ2-9     Counselor from 11/21/2018 in [facility] Most recent reading at 11/21/2018  9:00 AM Counselor from 11/04/2018 in [place] Most recent reading at 11/04/2018 11:51 AM Counselor from 11/05/2018 in [facility] Most recent reading at 11/04/2018  9:00 AM  PHQ-2 Total Score  2  6  4   PHQ-9 Total Score  8  16  13       Assessment and Plan:   Admitted to IOP  Continue medication as prescribed     Treatment plan was reviewed and agreed upon by NP T. Bobby Rumpf and Ashley Padilla Ashley Padilla need for group sessions.  Derrill Center, NP 12/20/201910:42 AM

## 2018-11-25 NOTE — Progress Notes (Signed)
    Daily Group Progress Note  Program: IOP  Group Time: 9am-12pm  Participation Level: Minimal  Behavioral Response: Appropriate  Type of Therapy:  Group Therapy; psycho-educational group, process group  Summary of Progress: The purpose of this group is to utilize CBT and DBT skills in a group setting to increase use of healthy coping skills and decrease frequency and intensity of active mental health symptoms.  9am-11am Clinician and group members discussed rules and expectations for group services. Clinician inquired about client strengths and reviewed the importance of ongoing positive self talk as a response to negative automatic thoughts. Clinician praised progress seen in group and group members supported each other with progress an positive traits observed. Clinician and group members discussed the importance of vulnerability and human connection to help improve mental health symptoms and building support system. Clinician presented activity focused on self reflection and building self confidence. Clinician actively listened to group members and validated thoughts and feelings. Clinician offered progressive muscle relaxation as a skill to manage anxiety levels as well as focusing on gratitude in difficult situations.  11am-12pm Chaplin facilitated process group related to grief and loss. Group processed where and how they learned expressing feelings as well as what types of losses have been experienced.  Olegario Messier, LCSW

## 2018-11-26 ENCOUNTER — Other Ambulatory Visit (HOSPITAL_COMMUNITY): Payer: BLUE CROSS/BLUE SHIELD

## 2018-11-26 ENCOUNTER — Other Ambulatory Visit (HOSPITAL_COMMUNITY): Payer: Medicare Other | Admitting: Licensed Clinical Social Worker

## 2018-11-26 DIAGNOSIS — F332 Major depressive disorder, recurrent severe without psychotic features: Secondary | ICD-10-CM | POA: Diagnosis not present

## 2018-11-26 NOTE — Psych (Signed)
   [facility] PHP THERAPIST PROGRESS NOTE  Ashley Padilla [id number]  Session Time: 9:00 - 11:00  Participation Level: Active  Behavioral Response: CasualAlertAnxious  Type of Therapy: Group Therapy  Treatment Goals addressed: Coping  Interventions: CBT, DBT, Solution Focused, Supportive and Reframing  Summary: Clinician led check-in regarding current stressors and situation, and review of patient completed daily inventory. Clinician utilized active listening and empathetic response and validated patient emotions. Clinician facilitated processing group on pertinent issues.   Therapist Response: Ashley Padilla is a 67 y.o. female who presents with depression and anxiety symptoms. Patient arrived within time allowed and reports that she is feeling "a little worked up." Patient rates her mood at a 4 on a scale of 1-10 with 10 being great. Pt shares when saying goodbye to her son this morning she accidentally knocked over his coffee on the leather sofa and "feels just horrible." Pt states feeling guilty and that her son was not concerned and told her not to worry about it. Pt is able to process and group helps her reframe. Pt reports her afternoon yesterday went well, even considering that her daughter-in-law was gone. Pt reports she began her new medication dosage yesterday and states noticing no negative side effects thus far.   Patient engaged in discussion.        Session Time: 11:00 -12:15  Participation Level: Active  Behavioral Response: CasualAlertDepressed  Type of Therapy: Group Therapy, psychotherapy  Treatment Goals addressed: Coping  Interventions: Strengths based, reframing, Supportive,   Summary:  Spiritual Care group  Therapist Response: Patient engaged in group. See chaplain note.         Session Time: 12:15 - 1:00  Participation Level: Active  Behavioral Response: CasualAlertDepressed  Type of Therapy: Group Therapy  Treatment Goals addressed:  Coping  Interventions: Systems analyst, Supportive  Summary:  Reflection Group: Patients encouraged to practice skills and interpersonal techniques or work on mindfulness and relaxation techniques. The importance of self-care and making skills part of a routine to increase usage were stressed   Therapist Response: Patient engaged and participated appropriately.        Session Time: 1:00- 2:00  Participation Level: Active  Behavioral Response: CasualAlertDepressed  Type of Therapy: Group Therapy, Psychoeducation  Treatment Goals addressed: Coping  Interventions: relaxation training; Supportive; Reframing  Summary: 12:45 - 1:50: Relaxation group: Cln led group focused on retraining the body's response to stress.   1:50 -2:00 Clinician led check-out. Clinician assessed for immediate needs, medication compliance and efficacy, and safety concerns   Therapist Response: Patient engaged in activity and discussion. At [place], patient rates her mood at a 6 on a scale of 1-10 with 10 being great. Patient reports this afternoon she will be alone from the end of group until she goes to sleep. Pt reports being anxious, but feeling like she can manage and has plans to walk, call her daughter, and eat dinner. Patient demonstrates some progress as evidenced by willingness to reframe guilty thoughts with group. Patient denies SI/HI/self-harm thoughts at the end of group.      Suicidal/Homicidal: Nowithout intent/plan  Plan: Pt will continue in PHP while working to decrease depression symptoms, increase ability to manage symptoms as they arise, and increase stability.   Diagnosis: Severe episode of recurrent major depressive disorder, without psychotic features ([place]) [F33.2]    1. Severe episode of recurrent major depressive disorder, without psychotic features ([place])       Lorin Glass, LCSW, LCAS 11/26/2018

## 2018-11-26 NOTE — Progress Notes (Signed)
    Daily Group Progress Note  Program: IOP  Group Time: 9am-12pm  Participation Level: Active  Behavioral Response: Appropriate  Type of Therapy:  Group Therapy; process group, psycho-educational group  Summary of Progress:  The purpose of this session is to utilize CBT and DBT skills in a group setting to increase utilization of healthy coping skills and decrease intensity of active mental health symptoms.  9am-11a: Clinician presented the topic of Personal boundaries. Clinician provided psycho-educational material related to types of boundaries and common traits of types of boundaries. Clinician and group members processed limits currently set in daily life. Clinician and group members discussed having different boundaries with different people and processed why this can be important. Clinician and group members role played plans for setting boundaries with family members and employers. Clinician praised group members verbalizing boundaries. Clinician and group members reviewed types of boundaries and examples including physical, emotional, sexual, material, and time boundaries. Clinician and group members processed how boundaries currently exist in their lives, how they want them to look, and brainstormed ways to get there.11am-12pm Co-facilitator for psycho-educational group from [facility]. Guest provided psycho-educational information on health and wellness in relation to mental health. Topics included sleep hygiene, healthy eating habits, and benefits to exercise.  Client participated in group discussions and activities. Client shared trouble setting boundaries with making decisions about where to move after program, contemplating what she wants vs what her family believes is best. Client was receptive to feedback on writing lists of pros and cons for each to help make a decision.  Olegario Messier, LCSW

## 2018-11-27 ENCOUNTER — Other Ambulatory Visit (HOSPITAL_COMMUNITY): Payer: BLUE CROSS/BLUE SHIELD

## 2018-11-27 ENCOUNTER — Ambulatory Visit (HOSPITAL_COMMUNITY): Payer: BLUE CROSS/BLUE SHIELD

## 2018-11-27 ENCOUNTER — Other Ambulatory Visit (HOSPITAL_COMMUNITY): Payer: Medicare Other | Admitting: Psychiatry

## 2018-11-27 DIAGNOSIS — F332 Major depressive disorder, recurrent severe without psychotic features: Secondary | ICD-10-CM

## 2018-11-27 NOTE — Progress Notes (Signed)
    Daily Group Progress Note  Program: IOP  Group Time: 9am-12pm  Participation Level: Active  Behavioral Response: Appropriate  Type of Therapy:  Group Therapy; process group, psycho-educational group  Summary of Progress:  The purpose of this group is to utilize CBT and DBT skills in a group setting to increase use of healthy coping skills and decrease intensity of active mental health symptoms.  9am-11am Clinician presented activity of dual self-portrait. Clinician and group members discussed differences in external and internal sense of self. Clinician utilize active listening and reflective statements to engage clients. Clinician presented the topic of Willingness vs Willfulness. Clinician and group members named thoughts or situations for both willing and willful mindsets.  Clinician presented the skill of Radical acceptance and reviewed with clients steps to implementing radical acceptance, as well as barriers to implementing skill. Clinician presented the skills of Half-Smiling and Willing Hands as ways to focus on accepting reality with your body.  11am-12pm Skills group facilitated utilizing yoga and breathing skills to focus on mindfulness and managing overwhelming emotions by staying in the moment. Client reports spending time with her son yesterday looking at apartments and taking the stairs since she did not walk yesterday as her self care. Client engaged in activities and discussions with group members. Client reports being happy to spend time around family.  Olegario Messier, LCSW

## 2018-11-28 ENCOUNTER — Other Ambulatory Visit (HOSPITAL_COMMUNITY): Payer: BLUE CROSS/BLUE SHIELD

## 2018-11-28 ENCOUNTER — Ambulatory Visit (HOSPITAL_COMMUNITY): Payer: BLUE CROSS/BLUE SHIELD

## 2018-11-28 ENCOUNTER — Other Ambulatory Visit (HOSPITAL_COMMUNITY): Payer: Medicare Other | Admitting: Psychiatry

## 2018-11-28 ENCOUNTER — Encounter (HOSPITAL_COMMUNITY): Payer: Self-pay

## 2018-11-28 ENCOUNTER — Encounter (HOSPITAL_COMMUNITY): Payer: Self-pay | Admitting: Family

## 2018-11-28 NOTE — Patient Instructions (Signed)
D:  Due to insurance purposes, pt is requesting discharge.  A:  Follow up with Micheline Chapman, LCSW on 12-16-18 @ 12 n and Dr. Doyne Keel on 01-15-19 @ 12:45 pm.  Encouraged support groups.  R:  Pt receptive.

## 2018-11-29 ENCOUNTER — Encounter (HOSPITAL_COMMUNITY): Payer: Self-pay | Admitting: Family

## 2018-11-29 MED ORDER — SERTRALINE HCL 100 MG PO TABS: 100.0000 mg | ORAL_TABLET | Freq: Every day | ORAL | 1 refills | Status: DC

## 2018-11-29 MED ORDER — QUETIAPINE FUMARATE 25 MG PO TABS: 75.0000 mg | ORAL_TABLET | Freq: Every day | ORAL | 0 refills | Status: DC

## 2018-11-29 NOTE — Progress Notes (Signed)
  [place] Intensive Outpatient Program Discharge Summary  Ashley Padilla [id number]  Admission date: 11/25/2018  Discharge date: 11/27/2018  Reason for admission: Per assessment note from [facility] note: Pt was admitted at the hospital in [place], [place] from 10/23/18 - 10/28/18 due to a suicide attempt and continued SI. 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 Pt states SI began approximately 2 weeks prior to her hospitalization due to an encounter from an old co-worker. Pt reports this is a man that she was attracted to when they worked together, for which she feels very guilty because she was married. Pt states the man shook her hand when they ran into her and that sent her into a spiral. Pt is unable to clearly say what it was about the interaction that undid her, but does share feelings of guilt and fearing she would bring "spiritual harm" to her children. Pt is unable to define what "spiritual harm" meant or why she had that concern. Pt does state "I struggle with my salvation and I think that's the root of all this." Pt reports conflict with thoughts that she considers "sinful" and forgiveness.  Chemical Use History: was denied  Family of Origin Issues: Ashley Padilla reports her son and daughter in law continues to be supportive during this outpatient admission. Reports her sibilings has offer her to come and stay with them in [place] after discharge. Patient reports taking things day by day. Patient to step down to [facility] Outpatient program on 11/25/2018.  Patient was unable to complete intensive outpatient program due to insurance approval.  Progress in Program Toward Treatment Goals: Ongoing, Ashley Padilla attended and participated with daily group sessions. Patient appears to be receptive with coping skills and on going therapy sessions.  Adjusted Zoloft and Seroquel. Patient to continue as directed.  Patient presents with a bright affect, improvement noted since admission. Medications was refilled at discharge.  Continues to deny suicidal or homicidal ideations.  Denies auditory or visual hallucinations.  Support encouragement reassurance was provided at discharge  Progress (rationale): Keep follow-up appointment with Doyne Keel 01/15/2019 and Micheline Chapman LCSW on 12/16/2018.  Patient was provided with additional resources for [facility] group services [facility]).   Take all medications as prescribed. Keep all follow-up appointments as scheduled.  Do not consume alcohol or use illegal drugs while on prescription medications. Report any adverse effects from your medications to your primary care provider promptly.  In the event of recurrent symptoms or worsening symptoms, call 911, a crisis hotline, or go to the nearest emergency department for evaluation.    Derrill Center, NP 11/29/2018

## 2018-11-29 NOTE — Addendum Note (Signed)
Addended by: Derrill Center on: 11/29/2018 03:43 PM   Modules accepted: Orders

## 2018-12-01 ENCOUNTER — Other Ambulatory Visit (HOSPITAL_COMMUNITY): Payer: Medicare Other

## 2018-12-01 ENCOUNTER — Ambulatory Visit (HOSPITAL_COMMUNITY): Payer: BLUE CROSS/BLUE SHIELD

## 2018-12-01 ENCOUNTER — Other Ambulatory Visit (HOSPITAL_COMMUNITY): Payer: Self-pay | Admitting: Family

## 2018-12-01 NOTE — Psych (Signed)
[facility] BH PHP THERAPIST PROGRESS NOTE  Ashley Padilla [id number]  Session Time: 9:00 - 11:00  Participation Level: Active  Behavioral Response: CasualAlertAnxious  Type of Therapy: Group Therapy  Treatment Goals addressed: Coping  Interventions: CBT, DBT, Solution Focused, Supportive and Reframing  Summary: Clinician led check-in regarding current stressors and situation, and review of patient completed daily inventory. Clinician utilized active listening and empathetic response and validated patient emotions. Clinician facilitated processing group on pertinent issues.   Therapist Response: Ashley Padilla is a 67 y.o. female who presents with depression and anxiety symptoms. Patient arrived within time allowed and reports that she is feeling "okay." Patient rates her mood at a "7" on a scale of 1-10 with 10 being great. Pt reports her solo afternoon went well yesterday and pt was proud of herself for staying grounded. Pt reports she got a manicure "just to be around people," talked to her daughter on the phone, and watched tv and had supper. Cln again offered pt a family session, and pt declines stating her children have access to call us if they have concerns, but pt does not feel it necessary at this time. Patient engaged in discussion.        Session Time: 11:00 -12:00   Participation Level: Active   Behavioral Response: CasualAlertDepressed   Type of Therapy: Group Therapy, OT   Treatment Goals addressed: Coping   Interventions: Psychosocial skills training, Supportive,    Summary:  Occupational Therapy group   Therapist Response: Patient engaged in group. See OT note.         Session Time: 12:00 - 12:45  Participation Level: Active  Behavioral Response: CasualAlertDepressed  Type of Therapy: Group Therapy, Activity Therapy  Treatment Goals addressed: Coping  Interventions: Systems analyst, Supportive  Summary:  Reflection Group: Patients encouraged to  practice skills and interpersonal techniques or work on mindfulness and relaxation techniques. The importance of self-care and making skills part of a routine to increase usage were stressed   Therapist Response: Patient engaged and participated appropriately.        Session Time: 12:45 - 2:00   Participation Level: Active   Behavioral Response: CasualAlertDepressed   Type of Therapy: Group Therapy, Psychotherapy; Psychoeducation   Treatment Goals addressed: Coping   Interventions: CBT, Solution focused, Supportive, Reframing   Summary: 12:45 - 1:50 Cln introduced topic of boundaries. Cln provided psychoeducation on what boundaries are, porous, rigid and healthy boundary characteristics, and the different types of boundaries. Group related the information to themselves to begin discovering potential boundary issues.  1:50 -2:00 Clinician led check-out. Clinician assessed for immediate needs, medication compliance and efficacy, and safety concerns      Therapist Response: Patient engaged in group. Pt reports understanding of boundaries and shares she has mainly porous boundaries.  At [place], patient rates her mood at a 8 on a scale of 1-10 with 10 being great. Pt reports afternoon plans of spending time with her son and having dinner with her daughter-in-law upon her return. Patient demonstrates some progress as evidenced by effectively managing symptoms and time on her first unsupervised day. Patient denies SI/HI/self-harm thoughts at the end of group.       Suicidal/Homicidal: Nowithout intent/plan  Plan: Pt will continue in PHP while working to decrease depression symptoms, increase ability to manage symptoms as they arise, and increase stability.   Diagnosis: Severe episode of recurrent major depressive disorder, without psychotic features ([place]) [F33.2]    1. Severe episode of recurrent major depressive  disorder, without psychotic features [facility])       Lorin Glass,  LCSW, LCAS 12/01/2018

## 2018-12-01 NOTE — Progress Notes (Signed)
Ashley Padilla is a 67 y.o. female who was transitioned from [facility] and started MH-IOP today.  Pt was in [place] since Thanksgiving week thru 11-21-18.  As per previous [facility] note:  Pt presents as referral from [facility], post discharge of inpatient admission. Pt was admitted at the hospital in [place], [place] from 10/23/18 - 10/28/18 due to a suicide attempt and continued SI. 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 Pt states SI began approximately 2 weeks prior to her hospitalization due to an encounter from an old co-worker. Pt reports this is a man that she was attracted to when they worked together, for which she feels very guilty because she was married. Pt states the man shook her hand when they ran into her and that sent her into a spiral. Pt is unable to clearly say what it was about the interaction that undid her, but does share feelings of guilt and fearing she would bring "spiritual harm" to her children. Pt is unable to define what "spiritual harm" meant or why she had that concern. Pt does state "I struggle with my salvation and I think that's the root of all this." Pt reports conflict with thoughts that she considers "sinful" and forgiveness. Pt denies having sought spiritual advisement on this issue which has been intermittent since her last child's birth, approximately 10 years. Pt does state "I don't feel like I should be alive, but know I shouldn't take my life." Pt denies current plan or intent for SI. Pt lives in [place] and will be staying with her oldest son and daughter-in-law while in treatment for extra support.  Pt states she is still struggling with depressive and anxiety symptoms.  Denies SI/HI or A/V hallucinations.   Due to insurance purposes, pt is requesting d/c.  Continues to deny SI/HI or A/V hallucinations.  A:  D/C today.   Encouraged support groups.  Referred pt to Dr Adele Schilder on 01-20-19 @ 9 a.m and Micheline Chapman, LCSW on 12-16-18 @ 12 noon.  R:  Pt receptive.          Carlis Abbott, RITA, M.Ed, CNA

## 2018-12-01 NOTE — Psych (Signed)
   [facility] BH PHP THERAPIST PROGRESS NOTE  Ashley Padilla [id number]  Session Time: 9:00 - 11:00  Participation Level: Active  Behavioral Response: CasualAlertAnxious  Type of Therapy: Group Therapy  Treatment Goals addressed: Coping  Interventions: CBT, DBT, Solution Focused, Supportive and Reframing  Summary: Clinician led check-in regarding current stressors and situation, and review of patient completed daily inventory. Clinician utilized active listening and empathetic response and validated patient emotions. Clinician facilitated processing group on pertinent issues.   Therapist Response: Diani Jillson is a 67 y.o. female who presents with depression and anxiety symptoms. Patient arrived within time allowed and reports that "for some reason, I feel okay." Patient rates her mood at a 7 on a scale of 1-10 with 10 being great. Pt reports she got things figured out with her needed shots and did some window shopping. Pt reports her son and daughter-in-law went out and she was able to be in the house from 9-12 by herself and do her bedtime routine with no problems. Pt shares she cooked dinner and "it was not great," but it was the first time she had cooked in a long time.  Patient continues to struggle with making future decisions. Patient engaged in discussion.      Session Time: 11:00 -12:00   Participation Level: Active   Behavioral Response: CasualAlertDepressed   Type of Therapy: Group Therapy, OT   Treatment Goals addressed: Coping   Interventions: Psychosocial skills training, Supportive,    Summary:  Occupational Therapy group   Therapist Response: Patient engaged in group. See OT note.         Session Time: 12:00 - 12:45  Participation Level: Active  Behavioral Response: CasualAlertDepressed  Type of Therapy: Group Therapy, Activity Therapy  Treatment Goals addressed: Coping  Interventions: Systems analyst, Supportive  Summary:  Reflection Group:  Patients encouraged to practice skills and interpersonal techniques or work on mindfulness and relaxation techniques. The importance of self-care and making skills part of a routine to increase usage were stressed   Therapist Response: Patient engaged and participated appropriately.       Session Time: 12:45- 2:00  Participation Level: Active  Behavioral Response: CasualAlertAnxious and Depressed  Type of Therapy: Group Therapy, Psychoeducation; Psychotherapy  Treatment Goals addressed: Coping  Interventions: CBT; Solution focused; Supportive; Reframing  Summary: 12:45 - 1:50: Clinician introduced topic of distress tolerance. Clinician educated group on what the skills are and their purpose. Cln introduced STOP and TIPP skill. Group members discussed how to incorporate this into their lives. 1:50 -2:00 Clinician led check-out. Clinician assessed for immediate needs, medication compliance and efficacy, and safety concerns   Therapist Response: Patient engaged activity and discussion. Pt reports this will be helpful when she is trying to push away.  At [place], patient rates her mood at a 7 on a scale of 1-10 with 10 being great. Patient reports afternoon plans of taking a walk and doing laundry. Patient demonstrates some progress as evidenced by working on decision making skills and increasing confidence in her independence. Patient denies SI/HI/self-harm at the end of group.     Suicidal/Homicidal: Nowithout intent/plan  Plan: Pt will continue in PHP while working to decrease depression symptoms, increase ability to manage symptoms as they arise, and increase stability.   Diagnosis: Severe episode of recurrent major depressive disorder, without psychotic features ([place]) [F33.2]    1. Severe episode of recurrent major depressive disorder, without psychotic features [facility])       Lorin Glass, LCSW, LCAS 12/01/2018

## 2018-12-02 ENCOUNTER — Ambulatory Visit (HOSPITAL_COMMUNITY): Payer: BLUE CROSS/BLUE SHIELD

## 2018-12-02 ENCOUNTER — Other Ambulatory Visit (HOSPITAL_COMMUNITY): Payer: Medicare Other

## 2018-12-02 NOTE — Psych (Signed)
[facility] BH PHP THERAPIST PROGRESS NOTE  Ashley Padilla [id number]  Session Time: 9:00 - 11:00  Participation Level: Active  Behavioral Response: CasualAlertAnxious  Type of Therapy: Group Therapy  Treatment Goals addressed: Coping  Interventions: CBT, DBT, Solution Focused, Supportive and Reframing  Summary: Clinician led check-in regarding current stressors and situation, and review of patient completed daily inventory. Clinician utilized active listening and empathetic response and validated patient emotions. Clinician facilitated processing group on pertinent issues.   Therapist Response: Ashley Padilla is a 67 y.o. female who presents with depression and anxiety symptoms. Patient arrived within time allowed and reports that "okay." Patient rates her mood at a 6 on a scale of 1-10 with 10 being great. Pt reports she did "a lot of pushing away" last night. Pt denies specific trigger but states thinks it is likely about thinking about the future and next steps. Pt states she spent yesterday doing laundry, walking, talking to her daughter on the phone, and having dinner with family.  Patient able to recognize the progress she has made. Patient engaged in discussion.       Session Time: 10:00 - 11:00   Participation Level: Active   Behavioral Response: CasualAlertDepressed   Type of Therapy: Group Therapy, Psychotherapy   Treatment Goals addressed: Coping   Interventions: CBT, Solution focused, Supportive, Reframing   Summary:  Group engaged in relationship workshop in which all members discussed relationship issues and problem solved.    Therapist Response: Patient engaged in group. Pt reports the relationships she is working on is with her family. Pt states that is her focus right now and she feels they are stable. Pt reports guilt remains to be an issue and that she is getting better at being able to reframe her thoughts of guilt when unfounded. Pt provides feedback to other group  members.        Session Time: 11:00 -12:00   Participation Level: Active   Behavioral Response: CasualAlertDepressed   Type of Therapy: Group Therapy, OT   Treatment Goals addressed: Coping   Interventions: Psychosocial skills training, Supportive,    Summary:  Occupational Therapy group   Therapist Response: Patient engaged in group. See OT note.          Session Time: 12:00- 1:00  Participation Level: Active  Behavioral Response: CasualAlertAnxious and Depressed  Type of Therapy: Group Therapy, Psychoeducation; Psychotherapy  Treatment Goals addressed: Coping  Interventions: CBT; Solution focused; Supportive; Reframing  Summary: 12:00 - 12:50: Clinician continued topic of distress tolerance skills and introduced Self Soothe skill. Group members discussed ways they would utilize self soothe in their everyday life.   12:50 -1:00 Clinician led check-out. Clinician assessed for immediate needs, medication compliance and efficacy, and safety concerns   Therapist Response: Patient engaged activity and discussion. Pt reports watching candles, wrapping up in a blanket, and smelling lavender as ways she can practice self soothe.  At [place], patient rates her mood at a 7 on a scale of 1-10 with 10 being great. Patient reports evening plans of celebrating her discharge with family and having a "low key" weekend.  Patient demonstrates some progress as evidenced by increased ability to manage negative thinking. Patient denies SI/HI/self-harm at the end of group.     Suicidal/Homicidal: Nowithout intent/plan  Plan: Pt will discharge from PHP due to meeting treatment goals of  Decreased depression symptoms, increased ability to manage symptoms as they arise, and increased stability. Pt's progress noted by self report, observation, and scales. Pt and  provider are aligned with discharge. Pt will step down to IOP within this agency on 11/25/18. Pt denies SI/HI/AVH at time of  discharge.     Diagnosis: Severe episode of recurrent major depressive disorder, without psychotic features ([place]) [F33.2]    1. Severe episode of recurrent major depressive disorder, without psychotic features [facility])       Lorin Glass, LCSW, LCAS 12/02/2018

## 2018-12-04 ENCOUNTER — Other Ambulatory Visit (HOSPITAL_COMMUNITY): Payer: Medicare Other

## 2018-12-05 ENCOUNTER — Other Ambulatory Visit (HOSPITAL_COMMUNITY): Payer: Medicare Other

## 2018-12-08 ENCOUNTER — Other Ambulatory Visit (HOSPITAL_COMMUNITY): Payer: Medicare Other

## 2018-12-16 ENCOUNTER — Ambulatory Visit (INDEPENDENT_AMBULATORY_CARE_PROVIDER_SITE_OTHER): Payer: Self-pay | Admitting: Licensed Clinical Social Worker

## 2018-12-16 DIAGNOSIS — F332 Major depressive disorder, recurrent severe without psychotic features: Secondary | ICD-10-CM

## 2018-12-16 NOTE — Progress Notes (Signed)
   THERAPIST PROGRESS NOTE  Session Time: 12:05pm-12:50pm  Participation Level: Active  Behavioral Response: Well GroomedAlertEuthymic  Type of Therapy: Individual Therapy  Treatment Goals addressed: Coping  Interventions: CBT, Motivational Interviewing and Supportive  Summary: Ashley Padilla is a 68 y.o. female who presents for follow up individual therapy after completion of PHP. 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 Client reports overall positive holiday with only some stress related to moving into her own apartment locally. Client reporting that her symptoms felt stable and she was pleased. Client shared she has been using the skills learned in her previous hospitalization and PHP to manage uncomfortable thoughts. Client was able to create a new thought response to her fear of uncomfortable thought about killing herself to protect her children, and of getting worse. Client says she has not had these thoughts but is preparing 'just in case.' Client created the response of acknowledging the thought and saying go away, my children told me they are not bothered. Client believes at this time she will be fine without individual therapy and will follow up with provider if symptoms worsen.    Suicidal/Homicidal: Nowithout intent/plan  Therapist Response: Clinician met with client and inquired about symptoms and skills used since discharged from PHP/IOP. Clinician praised client's use of learned skills to manage symptoms. Clinician utilized active listening and summarizing to validate client concerns about the possibility of fluctuating stability. Clinician worked with client to create response to thoughts and practice saying. Clinician provided client with A Mindful Year Calendar Journal in place of mood journal which client used only the past few days. Clinician encouraged client to  continue adding in mindful moments throughout her day. Clinician and client discussed progress and current severity of symptoms. Clinician provided client with psycho-education related to clients diagnosis.  Plan: As needed. Will follow up with psychiatrist for medication management services.  Diagnosis: Axis I: Major Depression, Recurrent severe     Ashley Messier, LCSW 12/16/2018

## 2018-12-19 ENCOUNTER — Other Ambulatory Visit (HOSPITAL_COMMUNITY): Payer: Self-pay

## 2018-12-19 MED ORDER — QUETIAPINE FUMARATE 25 MG PO TABS: 75.0000 mg | ORAL_TABLET | Freq: Every day | ORAL | 0 refills | Status: DC

## 2018-12-19 MED ORDER — SERTRALINE HCL 100 MG PO TABS: 100.0000 mg | ORAL_TABLET | Freq: Every day | ORAL | 1 refills | Status: DC

## 2019-01-07 ENCOUNTER — Ambulatory Visit (INDEPENDENT_AMBULATORY_CARE_PROVIDER_SITE_OTHER): Payer: Medicare Other | Admitting: Licensed Clinical Social Worker

## 2019-01-07 DIAGNOSIS — F332 Major depressive disorder, recurrent severe without psychotic features: Secondary | ICD-10-CM | POA: Diagnosis not present

## 2019-01-07 NOTE — Progress Notes (Signed)
   THERAPIST PROGRESS NOTE  Session Time: 2pm-2:50pm  Participation Level: Active  Behavioral Response: CasualAlertEuthymic  Type of Therapy: Individual Therapy  Treatment Goals addressed: Coping  Interventions: CBT, Motivational Interviewing and Supportive  Summary: Ashley Padilla is a 68 y.o. female who presents with symptoms of mild depression, improved and stable per client report.   Suicidal/Homicidal: Nowithout intent/plan  Therapist Response: Clinician met with client, assessing for SI/HI/psychosis and overall level of functioning. Clinician inquired about any skills used since last appointment. Clinician shared concerns voiced by family related for their desire for client ot have continued treatment. Clinician validated client's desire to maintain mood independently. Clinician and client processed lack of practice decision making based on previous relationship roles. Clinician and client problem solved ways to empower client decision making. Clinician and client role played assertive communication statements and positive self talk.  Plan: Return again in 2-4 weeks as needed. Client would like to meet with primary psychiatrist prior to re-scheduling therapy.  Diagnosis: Axis I: Major Depressive Disorder, recurrent, mild      Olegario Messier, LCSW 01/07/2019

## 2019-01-10 ENCOUNTER — Other Ambulatory Visit (HOSPITAL_COMMUNITY): Payer: Self-pay | Admitting: Family

## 2019-01-15 ENCOUNTER — Ambulatory Visit (HOSPITAL_COMMUNITY): Payer: Medicare Other | Admitting: Psychiatry

## 2019-01-15 ENCOUNTER — Encounter (HOSPITAL_COMMUNITY): Payer: Self-pay | Admitting: Psychiatry

## 2019-01-15 DIAGNOSIS — F411 Generalized anxiety disorder: Secondary | ICD-10-CM | POA: Insufficient documentation

## 2019-01-15 DIAGNOSIS — F331 Major depressive disorder, recurrent, moderate: Secondary | ICD-10-CM | POA: Diagnosis not present

## 2019-01-15 DIAGNOSIS — F3342 Major depressive disorder, recurrent, in full remission: Secondary | ICD-10-CM | POA: Insufficient documentation

## 2019-01-15 MED ORDER — QUETIAPINE FUMARATE 50 MG PO TABS: 50.0000 mg | ORAL_TABLET | Freq: Every day | ORAL | 0 refills | Status: DC

## 2019-01-15 MED ORDER — SERTRALINE HCL 100 MG PO TABS: 150.0000 mg | ORAL_TABLET | Freq: Every day | ORAL | 0 refills | Status: DC

## 2019-01-15 NOTE — Progress Notes (Signed)
Psychiatric Initial Adult Assessment   Patient Identification: Ashley Padilla MRN:  [id number] Date of Evaluation:  01/15/2019 Referral Source: Dr. Brent Bulla Chief Complaint:  Depression, guilt, anxiety Visit Diagnosis:    ICD-10-CM   1. Moderate episode of recurrent major depressive disorder (HCC) F33.1   2. GAD (generalized anxiety disorder) F41.1     History of Present Illness:  67 yo widowed female whose husband passed away in 01/21/2012. She grieved appropriately and felt emotionally stable until ,while on a walk with her friends, she met a man she was secretly infatuated with while her husband was still alive. He shook her hand and since that meeting she has bee emotionally torn with guilt, anxiety, depression. The man in question is married and she still clearly has strong feeling for her whereas her baptist upbringing/beliefs are at the core of internal conflict. She actively avoids any chance she will meet him again. Depression worsend to a point of becoming suicidal and getting self admitted to a hospital in [place], [place] in December last year. She has been started on sertraline and quetiapine there. She subsequently came to [place] where her oldest son and daughter-in-law live and enetered [facility]. She is still depressed, worries/obsesses about the above described encounter, has some difficulty with sleep although quetiapine has helped with that. She is no longer suicidal and denies being hopeless. There is no irritability, impulsivity, mania, psychosis. She admits to one previous episode of depression in 01/20/1981 in postpartum period (after she delivered her oldest son). She was placed on an unknown antidepressant at that time with good response. No further episodes of depression followed until the current one. She does not abuse alcohol or drugs.  Associated Signs/Symptoms: Depression Symptoms:  depressed mood, feelings of worthlessness/guilt, difficulty concentrating, anxiety, increased  appetite, (Hypo) Manic Symptoms:  none Anxiety Symptoms:  Excessive Worry, Psychotic Symptoms:  None PTSD Symptoms: Negative  Past Psychiatric History: see above  Previous Psychotropic Medications: Yes   Substance Abuse History in the last 12 months:  No.  Consequences of Substance Abuse: Negative  Past Medical History:  Past Medical History:  Diagnosis Date  . Anxiety   . Depression   . Hypertension 01-20-2015    Past Surgical History:  Procedure Laterality Date  . COLON SURGERY  01-20-09   Colon Resection  . COLON SURGERY  2010-01-20   Ostomy reversal     Family Psychiatric History: reviewed  Family History:  Family History  Problem Relation Age of Onset  . Anxiety disorder Mother   . Depression Mother   . Alcohol abuse Father   . Anxiety disorder Sister   . Depression Sister   . Anxiety disorder Maternal Grandmother   . Depression Maternal Grandmother     Social History:   Social History   Socioeconomic History  . Marital status: Unknown    Spouse name: Not on file  . Number of children: Not on file  . Years of education: Not on file  . Highest education level: Not on file  Occupational History  . Not on file  Social Needs  . Financial resource strain: Not very hard  . Food insecurity:    Worry: Never true    Inability: Never true  . Transportation needs:    Medical: No    Non-medical: No  Tobacco Use  . Smoking status: Never Smoker  . Smokeless tobacco: Never Used  Substance and Sexual Activity  . Alcohol use: Not Currently  . Drug use: Not Currently  . Sexual  activity: Not Currently  Lifestyle  . Physical activity:    Days per week: 7 days    Minutes per session: 40 min  . Stress: Very much  Relationships  . Social connections:    Talks on phone: More than three times a week    Gets together: Once a week    Attends religious service: Never    Active member of club or organization: No    Attends meetings of clubs or organizations: Never     Relationship status: Widowed  Other Topics Concern  . Not on file  Social History Narrative  . Not on file    Additional Social History: She is a retired Pharmacist, hospital. Lived in [place] until moving her (she hopes temporarily). She is staying with her oldest son at this time.  Allergies:   Allergies  Allergen Reactions  . Penicillins Other (See Comments)    Reports had a reaction as a baby but not sure what occurred.   . Adhesive [Tape] Rash    Metabolic Disorder Labs: No results found for: HGBA1C, MPG No results found for: PROLACTIN No results found for: CHOL, TRIG, HDL, CHOLHDL, VLDL, LDLCALC No results found for: TSH  Therapeutic Level Labs: No results found for: LITHIUM No results found for: CBMZ No results found for: VALPROATE  Current Medications: Current Outpatient Medications  Medication Sig Dispense Refill  . lisinopril (PRINIVIL,ZESTRIL) 20 MG tablet Take 20 mg by mouth daily.    . QUEtiapine (SEROQUEL) 50 MG tablet Take 1 tablet (50 mg total) by mouth at bedtime for 30 days. 30 tablet 0  . sertraline (ZOLOFT) 100 MG tablet Take 1.5 tablets (150 mg total) by mouth daily for 30 days. 45 tablet 0   No current facility-administered medications for this visit.     Musculoskeletal: Strength & Muscle Tone: within normal limits Gait & Station: normal Patient leans: N/A  Psychiatric Specialty Exam: Review of Systems  Constitutional: Negative.   HENT: Negative.   Eyes: Negative.   Respiratory: Negative.   Cardiovascular: Negative.   Gastrointestinal: Negative.   Genitourinary: Negative.   Musculoskeletal: Negative.   Skin: Negative.   Neurological: Negative.   Endo/Heme/Allergies: Negative.   Psychiatric/Behavioral: Positive for depression. The patient is nervous/anxious.     There were no vitals taken for this visit.There is no height or weight on file to calculate BMI.  General Appearance: Well Groomed  Eye Contact:  Good  Speech:  Clear and Coherent   Volume:  Normal  Mood:  Anxious and Depressed  Affect:  Non-Congruent and Full Range  Thought Process:  Goal Directed  Orientation:  Full (Time, Place, and Person)  Thought Content:  Logical  Suicidal Thoughts:  No  Homicidal Thoughts:  No  Memory:  Immediate;   Good Recent;   Good Remote;   Good  Judgement:  Fair  Insight:  Fair  Psychomotor Activity:  Normal  Concentration:  Concentration: Good and Attention Span: Good  Recall:  Good  Fund of Knowledge:Good  Language: Good  Akathisia:  Negative  Handed:  Right  AIMS (if indicated):  not done  Assets:  Communication Skills Desire for [place] Talents/Skills Vocational/Educational  ADL's:  Intact  Cognition: WNL  Sleep:  Fair   Screenings: GAD-7     Counselor from 11/21/2018 in [place] Counselor from 11/05/2018 in [place]  Total GAD-7 Score  6  13    PHQ2-9     Counselor  from 11/21/2018 in [place] Most recent reading at 11/21/2018  9:00 AM Counselor from 11/04/2018 in [place] Most recent reading at 11/04/2018 11:51 AM Counselor from 11/05/2018 in [place] Most recent reading at 11/04/2018  9:00 AM  PHQ-2 Total Score  _0 PHQ-9 Total Score  _1 Assessment and Plan: 68 yo widow with major depressive episode and  Generalized anxiety triggered by accidental meeting with a man she admits she was infatuated Public affairs consultant) with in the past. Because they were both married they did not engage in a close relationship back than and now, as he is still married, she feels extreme guilt about having still feelings for him. This internal conflict culminated in developing suicidal thoughts and inpatient stay. She than entered our PHP which she completed. No longer  suicidal but still feels to some extent depressed and anxious. Sleep good with quetiapine but she noticed she is constantly hungry. She has one previous episode of clinical depression in a postpartum period many years ago.  Diagnostic impression: Major depressive disorder recurrent, moderate; Generalized anxiety disorder  Plan: Increase sertraline diose to 150 mg daily and decrease quetiapine to 50 mg at HS. Return to clinic in one month. She also plans to start individual counseling. The plan was discussed with patient. I spend 60 minutes in direct face to face clinical contact with the patient and devoted approximately 50% of this time to explanation of diagnosis, discussion of treatment options and med education.   Stephanie Acre, MD 2/6/20202:31 PM

## 2019-01-20 ENCOUNTER — Ambulatory Visit (HOSPITAL_COMMUNITY): Payer: BLUE CROSS/BLUE SHIELD | Admitting: Psychiatry

## 2019-02-05 ENCOUNTER — Encounter (HOSPITAL_COMMUNITY): Payer: Self-pay | Admitting: Psychiatry

## 2019-02-05 ENCOUNTER — Ambulatory Visit (HOSPITAL_COMMUNITY): Payer: Medicare Other | Admitting: Psychiatry

## 2019-02-05 VITALS — BP 112/78 | HR 78 | Ht 68.25 in | Wt 188.0 lb

## 2019-02-05 DIAGNOSIS — F331 Major depressive disorder, recurrent, moderate: Secondary | ICD-10-CM

## 2019-02-05 DIAGNOSIS — F411 Generalized anxiety disorder: Secondary | ICD-10-CM

## 2019-02-05 MED ORDER — QUETIAPINE FUMARATE 25 MG PO TABS: 75.0000 mg | ORAL_TABLET | Freq: Every day | ORAL | 0 refills | Status: DC

## 2019-02-05 MED ORDER — SERTRALINE HCL 100 MG PO TABS: 150.0000 mg | ORAL_TABLET | Freq: Every day | ORAL | 0 refills | Status: DC

## 2019-02-05 NOTE — Progress Notes (Signed)
BH MD/PA/NP OP Progress Note  02/05/2019 9:40 AM Ashley Padilla  MRN:  [id number]  Chief Complaint: Anxiety, guilt, insomnia, some depression. HPI: 68 yo widowed female with major depression, GAD and irrational giult over having feelings for a married man he met in the past and recently gain while walking a trail. This cause so much distress that she started to feel suicidal and admitted self to a psychiatric hospital. Some residual depression and marked anxiety continued and we have made few adjustments to medications she was discharged on late last year. Dose of Zoloft was increased to 150 mg whereas Seroquel was decreased from 75 mg to 50 mg at HS. She noticed improvement in mood but has more problems with sleep now. No SI, no psychosis. She just started seeing a new therapist yesterday.  Visit Diagnosis:    ICD-10-CM   1. Moderate episode of recurrent major depressive disorder (HCC) F33.1   2. GAD (generalized anxiety disorder) F41.1     Past Psychiatric History: Please refer to H&P in initial assessment note.  Past Medical History:  Past Medical History:  Diagnosis Date  . Anxiety   . Depression   . Hypertension 2016    Past Surgical History:  Procedure Laterality Date  . COLON SURGERY  2010   Colon Resection  . COLON SURGERY  2011   Ostomy reversal     Family Psychiatric History: reviewed  Family History:  Family History  Problem Relation Age of Onset  . Anxiety disorder Mother   . Depression Mother   . Alcohol abuse Father   . Anxiety disorder Sister   . Depression Sister   . Anxiety disorder Maternal Grandmother   . Depression Maternal Grandmother     Social History:  Social History   Socioeconomic History  . Marital status: Unknown    Spouse name: Not on file  . Number of children: Not on file  . Years of education: Not on file  . Highest education level: Not on file  Occupational History  . Not on file  Social Needs  . Financial resource strain: Not very  hard  . Food insecurity:    Worry: Never true    Inability: Never true  . Transportation needs:    Medical: No    Non-medical: No  Tobacco Use  . Smoking status: Never Smoker  . Smokeless tobacco: Never Used  Substance and Sexual Activity  . Alcohol use: Not Currently  . Drug use: Not Currently  . Sexual activity: Not Currently  Lifestyle  . Physical activity:    Days per week: 7 days    Minutes per session: 40 min  . Stress: Very much  Relationships  . Social connections:    Talks on phone: More than three times a week    Gets together: Once a week    Attends religious service: Never    Active member of club or organization: No    Attends meetings of clubs or organizations: Never    Relationship status: Widowed  Other Topics Concern  . Not on file  Social History Narrative  . Not on file    Allergies:  Allergies  Allergen Reactions  . Penicillins Other (See Comments)    Reports had a reaction as a baby but not sure what occurred.   . Adhesive [Tape] Rash    Metabolic Disorder Labs: No results found for: HGBA1C, MPG No results found for: PROLACTIN No results found for: CHOL, TRIG, HDL, CHOLHDL, VLDL, LDLCALC No results  found for: TSH  Therapeutic Level Labs: No results found for: LITHIUM No results found for: VALPROATE No components found for:  CBMZ  Current Medications: Current Outpatient Medications  Medication Sig Dispense Refill  . lisinopril (PRINIVIL,ZESTRIL) 20 MG tablet Take 20 mg by mouth daily.    . Multiple Vitamin (MULTIVITAMIN) capsule Take 1 capsule by mouth daily.    . QUEtiapine (SEROQUEL) 50 MG tablet Take 1 tablet (50 mg total) by mouth at bedtime for 30 days. 30 tablet 0  . sertraline (ZOLOFT) 100 MG tablet Take 1.5 tablets (150 mg total) by mouth daily for 30 days. 45 tablet 0   No current facility-administered medications for this visit.      Musculoskeletal: Strength & Muscle Tone: within normal limits Gait & Station:  normal Patient leans: N/A  Psychiatric Specialty Exam: Review of Systems  Constitutional: Negative.   HENT: Negative.   Eyes: Negative.   Respiratory: Negative.   Cardiovascular: Negative.   Gastrointestinal: Negative.   Genitourinary: Negative.   Musculoskeletal: Negative.   Skin: Negative.   Neurological: Negative.   Endo/Heme/Allergies: Negative.   Psychiatric/Behavioral: The patient is nervous/anxious.     Blood pressure 112/78, pulse 78, height 5' 8.25" (1.734 m), weight 188 lb (85.3 kg), SpO2 97 %.Body mass index is 28.38 kg/m.  General Appearance: Well Groomed  Eye Contact:  Good  Speech:  Clear and Coherent  Volume:  Normal  Mood:  Anxious  Affect:  Full Range  Thought Process:  Goal Directed  Orientation:  Full (Time, Place, and Person)  Thought Content: Logical   Suicidal Thoughts:  No  Homicidal Thoughts:  No  Memory:  Immediate;   Good Recent;   Good Remote;   Good  Judgement:  Intact  Insight:  Fair  Psychomotor Activity:  Normal  Concentration:  Concentration: Good  Recall:  Good  Fund of Knowledge: Good  Language: Good  Akathisia:  Negative  Handed:  Right  AIMS (if indicated): not done  Assets:  Communication Skills Desire for Improvement Financial Resources/Insurance Housing Leisure Time Physical Health Social Support Vocational/Educational  ADL's:  Intact  Cognition: WNL  Sleep:  Fair   Screenings: GAD-7     Counselor from 11/21/2018 in [place] Counselor from 11/05/2018 in [place]  Total GAD-7 Score  6  13    PHQ2-9     Counselor from 11/21/2018 in [place] Most recent reading at 11/21/2018  9:00 AM Counselor from 11/04/2018 in [place] Most recent reading at 11/04/2018 11:51 AM Counselor from 11/05/2018 in [place] Most recent  reading at 11/04/2018  9:00 AM  PHQ-2 Total Score  '2  6  4  ' PHQ-9 Total Score  '8  16  13       ' Assessment and Plan: 68 yo widowed female with GAD and major depression. Mood improving after dose of sertraline was increased to 150 mg but Izora Gala reports more problems with sleep after Seroquel dose was deceased by 25 mg to 50 mg at HS. We will increase it back to 75 mg at bedtime. She started in individual therapy with Delaine Lame PhD psychologist yesterday. No SI, no psychosis. She has an apartment rented till December this year but still thinks that she will move back to [place] if her mood stabilizes and anxiety goes away. Supportive son and daughter-in-law locally. Patient will return to clinic in 2.5 months.   [place],  MD 02/05/2019, 9:40 AM

## 2019-02-13 ENCOUNTER — Ambulatory Visit: Payer: Medicare Other | Admitting: Internal Medicine

## 2019-03-30 ENCOUNTER — Emergency Department (HOSPITAL_COMMUNITY): Payer: Medicare Other

## 2019-03-30 ENCOUNTER — Other Ambulatory Visit: Payer: Self-pay

## 2019-03-30 ENCOUNTER — Emergency Department (HOSPITAL_COMMUNITY)
Admission: EM | Admit: 2019-03-30 | Discharge: 2019-03-30 | Disposition: A | Discharge status: Home / Self Care | Payer: Medicare Other | Attending: Emergency Medicine | Admitting: Emergency Medicine

## 2019-03-30 ENCOUNTER — Encounter (HOSPITAL_COMMUNITY): Payer: Self-pay | Admitting: Emergency Medicine

## 2019-03-30 DIAGNOSIS — I10 Essential (primary) hypertension: Secondary | ICD-10-CM | POA: Insufficient documentation

## 2019-03-30 DIAGNOSIS — S6991XA Unspecified injury of right wrist, hand and finger(s), initial encounter: Secondary | ICD-10-CM | POA: Diagnosis present

## 2019-03-30 DIAGNOSIS — Y999 Unspecified external cause status: Secondary | ICD-10-CM | POA: Diagnosis not present

## 2019-03-30 DIAGNOSIS — Y929 Unspecified place or not applicable: Secondary | ICD-10-CM | POA: Insufficient documentation

## 2019-03-30 DIAGNOSIS — Z79899 Other long term (current) drug therapy: Secondary | ICD-10-CM | POA: Diagnosis not present

## 2019-03-30 DIAGNOSIS — W19XXXA Unspecified fall, initial encounter: Secondary | ICD-10-CM | POA: Insufficient documentation

## 2019-03-30 DIAGNOSIS — Y939 Activity, unspecified: Secondary | ICD-10-CM | POA: Insufficient documentation

## 2019-03-30 DIAGNOSIS — S52541A Smith's fracture of right radius, initial encounter for closed fracture: Secondary | ICD-10-CM | POA: Diagnosis not present

## 2019-03-30 MED ORDER — ONDANSETRON HCL 4 MG/2ML IJ SOLN
4.0000 mg | Freq: Once | INTRAMUSCULAR | Status: DC
  Filled 2019-03-30: qty 2

## 2019-03-30 MED ORDER — LACTATED RINGERS IV BOLUS
1000.0000 mL | Freq: Once | INTRAVENOUS | Status: AC
  Administered 2019-03-30: 21:00:00 1000 mL via INTRAVENOUS

## 2019-03-30 MED ORDER — FENTANYL CITRATE (PF) 100 MCG/2ML IJ SOLN
INTRAMUSCULAR | Status: AC
  Filled 2019-03-30: qty 2

## 2019-03-30 MED ORDER — FENTANYL CITRATE (PF) 100 MCG/2ML IJ SOLN
INTRAMUSCULAR | Status: AC | PRN
  Administered 2019-03-30: 50 ug via INTRAVENOUS

## 2019-03-30 MED ORDER — OXYCODONE HCL 5 MG PO TABS: 5.0000 mg | ORAL_TABLET | ORAL | 0 refills | Status: DC | PRN

## 2019-03-30 MED ORDER — ETOMIDATE 2 MG/ML IV SOLN
INTRAVENOUS | Status: AC | PRN
  Administered 2019-03-30: 6 mg via INTRAVENOUS
  Administered 2019-03-30: 2 mg via INTRAVENOUS

## 2019-03-30 MED ORDER — ETOMIDATE 2 MG/ML IV SOLN
10.0000 mg | Freq: Once | INTRAVENOUS | Status: DC
  Filled 2019-03-30: qty 10

## 2019-03-30 NOTE — ED Triage Notes (Signed)
Pt arrives to ED with co of broken wrist. Pt was walking [place] [address] and tripped and fell over the uneven sidewalk and she caught her fall on her right wrist.

## 2019-03-30 NOTE — Progress Notes (Signed)
Orthopedic Tech Progress Note Patient Details:  Ashley Padilla Sep 22, 1951 [id number]  Ortho Devices Type of Ortho Device: Arm sling, Sugartong splint Ortho Device/Splint Location: rue. splint applied post reduction with drs assistance. Dr requested volar angulation.Manson Passey Device/Splint Interventions: Ordered, Application, Adjustment   Post Interventions Patient Tolerated: Well Instructions Provided: Care of device, Adjustment of device   Karolee Stamps 03/30/2019, 9:54 PM

## 2019-03-30 NOTE — Sedation Documentation (Signed)
Patient is resting comfortably and denies pain

## 2019-03-30 NOTE — ED Notes (Signed)
ED Provider at bedside. 

## 2019-03-30 NOTE — ED Notes (Signed)
Respiratory and Ortho Notified of sedation to take place.

## 2019-03-30 NOTE — Sedation Documentation (Signed)
Reduction successful. Orders for post xray. Pt remains stable denies pain.

## 2019-03-30 NOTE — ED Provider Notes (Addendum)
[facility] Provider Note   CSN: [id number] Arrival date & time: 03/30/19  1619    History   Chief Complaint Chief Complaint  Patient presents with  . Wrist Pain    HPI Ashley Padilla is a 68 y.o. female.     HPI 68 year old woman with past medical history of hypertension presents to the emergency department for evaluation after a fall onto her right outstretched hand.  Notable deformity to the right wrist with pain.  Still able to move her fingers.  No other injuries noted except for a small abrasion to the left knee.  She does complain of some pain over the thenar eminence of the left hand.  No other injuries noted.  Patient has been feeling well lately and has not had any recent changes in bowel or bladder habits, does not have any fevers, and appetite is normal.  Other complaints at this time. Past Medical History:  Diagnosis Date  . Anxiety   . Depression   . Hypertension 2016    Patient Active Problem List   Diagnosis Date Noted  . Moderate episode of recurrent major depressive disorder ([place]) 01/15/2019  . GAD (generalized anxiety disorder) 01/15/2019    Past Surgical History:  Procedure Laterality Date  . COLON SURGERY  2010   Colon Resection  . COLON SURGERY  2011   Ostomy reversal   . WRIST SURGERY Left      OB History   No obstetric history on file.      Home Medications    Prior to Admission medications   Medication Sig Start Date End Date Taking? Authorizing Provider  lisinopril (PRINIVIL,ZESTRIL) 20 MG tablet Take 20 mg by mouth daily.   Yes [provider]  Multiple Vitamin (MULTIVITAMIN) capsule Take 1 capsule by mouth daily.   Yes [provider]  QUEtiapine (SEROQUEL) 25 MG tablet Take 3 tablets (75 mg total) by mouth at bedtime. 02/05/19 05/06/19 Yes Pucilowski, Olgierd A, MD  sertraline (ZOLOFT) 100 MG tablet Take 1.5 tablets (150 mg total) by mouth daily. 02/05/19 05/06/19 Yes Pucilowski,  Marchia Bond, MD    Family History Family History  Problem Relation Age of Onset  . Anxiety disorder Mother   . Depression Mother   . Alcohol abuse Father   . Anxiety disorder Sister   . Depression Sister   . Anxiety disorder Maternal Grandmother   . Depression Maternal Grandmother     Social History Social History   Tobacco Use  . Smoking status: Never Smoker  . Smokeless tobacco: Never Used  Substance Use Topics  . Alcohol use: Not Currently  . Drug use: Not Currently     Allergies   Penicillins and Adhesive [tape]   Review of Systems Review of Systems  Constitutional: Negative for chills and fever.  HENT: Negative for ear pain and sore throat.   Eyes: Negative for pain and visual disturbance.  Respiratory: Negative for cough and shortness of breath.   Cardiovascular: Negative for chest pain and palpitations.  Gastrointestinal: Negative for abdominal pain and vomiting.  Genitourinary: Negative for dysuria and hematuria.  Musculoskeletal: Positive for arthralgias. Negative for back pain.  Skin: Negative for color change and rash.  Neurological: Negative for seizures and syncope.  All other systems reviewed and are negative.    Physical Exam Updated Vital Signs BP (!) 171/98   Pulse 75   Ht 5\' 10"  (1.778 m)   Wt 83.9 kg   SpO2 96%   BMI  26.54 kg/m   Physical Exam Vitals signs and nursing note reviewed.  Constitutional:      General: She is not in acute distress.    Appearance: She is well-developed.  HENT:     Head: Normocephalic and atraumatic.  Eyes:     Conjunctiva/sclera: Conjunctivae normal.  Neck:     Musculoskeletal: Neck supple.  Cardiovascular:     Rate and Rhythm: Normal rate and regular rhythm.     Heart sounds: No murmur.  Pulmonary:     Effort: Pulmonary effort is normal. No respiratory distress.     Breath sounds: Normal breath sounds.  Abdominal:     Palpations: Abdomen is soft.     Tenderness: There is no abdominal tenderness.   Musculoskeletal:        General: Deformity present.     Comments: Mild deformity noted to the right wrist with dorsal hematoma.  5-5 strength in bilateral grip strength as well as abduction of the fingers and extension of the fingers.  Intact bilateral radial pulses.  Skin:    General: Skin is warm and dry.     Comments: Mild abrasion noted over the left patella.  Neurological:     General: No focal deficit present.     Mental Status: She is alert and oriented to person, place, and time. Mental status is at baseline.     Comments: Still has good sensation in all digits bilaterally.  Ambulatory in the ED without ataxia.      ED Treatments / Results  Labs (all labs ordered are listed, but only abnormal results are displayed) Labs Reviewed - No data to display  EKG None  Radiology No results found.  Procedures .Sedation Date/Time: 03/30/2019 9:56 PM Performed by: Andee Poles, MD Authorized by: Andee Poles, MD   Consent:    Consent obtained:  Written   Consent given by:  Patient   Risks discussed:  Allergic reaction, dysrhythmia, inadequate sedation, nausea, prolonged hypoxia resulting in organ damage, prolonged sedation necessitating reversal, respiratory compromise necessitating ventilatory assistance and intubation and vomiting Universal protocol:    Immediately prior to procedure a time out was called: yes   Indications:    Procedure performed:  Fracture reduction   Procedure necessitating sedation performed by:  Physician performing sedation Pre-sedation assessment:    Time since last food or drink:  6 hours   ASA classification: class 1 - normal, healthy patient     Mouth opening:  3 or more finger widths   Mallampati score:  I - soft palate, uvula, fauces, pillars visible   Pre-sedation assessments completed and reviewed: airway patency not reviewed, cardiovascular function not reviewed, hydration status not reviewed, mental status not reviewed,  nausea/vomiting not reviewed, pain level not reviewed, respiratory function not reviewed and temperature not reviewed   Immediate pre-procedure details:    Reviewed: vital signs, relevant labs/tests and NPO status     Verified: bag valve mask available, emergency equipment available, intubation equipment available, IV patency confirmed, oxygen available and suction available   Procedure details (see MAR for exact dosages):    Preoxygenation:  Nasal cannula   Sedation:  Etomidate   Analgesia:  Fentanyl   Intra-procedure monitoring:  Blood pressure monitoring, cardiac monitor, continuous capnometry, continuous pulse oximetry, frequent LOC assessments and frequent vital sign checks   Intra-procedure events: none     Total Provider sedation time (minutes):  20 Post-procedure details:    Post-sedation assessments completed and reviewed: airway patency not reviewed, cardiovascular function not  reviewed, hydration status not reviewed, mental status not reviewed, nausea/vomiting not reviewed and pain level not reviewed     Patient is stable for discharge or admission: yes     Patient tolerance:  Tolerated well, no immediate complications Reduction of fracture Date/Time: 03/30/2019 9:57 PM Performed by: Andee Poles, MD Authorized by: Andee Poles, MD  Consent: Verbal consent obtained. Written consent obtained. Consent given by: patient Patient understanding: patient states understanding of the procedure being performed Patient consent: the patient's understanding of the procedure matches consent given Patient identity confirmed: verbally with patient and arm band Time out: Immediately prior to procedure a "time out" was called to verify the correct patient, procedure, equipment, support staff and site/side marked as required. Preparation: Patient was prepped and draped in the usual sterile fashion. Local anesthesia used: no  Anesthesia: Local anesthesia used:  no  Sedation: Patient sedated: yes Sedation type: moderate (conscious) sedation Sedatives: etomidate Vitals: Vital signs were monitored during sedation.  Patient tolerance: Patient tolerated the procedure well with no immediate complications    (including critical care time)  Medications Ordered in ED Medications - No data to display   Initial Impression / Assessment and Plan / ED Course  I have reviewed the triage vital signs and the nursing notes.  Pertinent labs & imaging results that were available during my care of the patient were reviewed by me and considered in my medical decision making (see chart for details).       Well-appearing hemodynamically stable 68 year old woman presents to the emergency department after a fall with angulated right wrist.  X-ray showed acute volar displacement of the distal radius and ulna fracture.  Neurovascularly intact on arrival.  Moderate sedation performed at the bedside with etomidate with good alignment after reduction and splinting at the bedside.  Patient tolerated the procedure well.  Patient will follow-up with hand surgery as noted in the follow-up instructions. PDMP database consulted.  Final Clinical Impressions(s) / ED Diagnoses   Final diagnoses:  Closed Smith's fracture of right radius, initial encounter  Fall, initial encounter    ED Discharge Orders         Ordered    oxyCODONE (ROXICODONE) 5 MG immediate release tablet  Every 4 hours PRN     03/30/19 2153           Andee Poles, MD 03/30/19 2159    Andee Poles, MD 03/30/19 2159    Varney Biles, MD 03/31/19 [phone]

## 2019-04-16 ENCOUNTER — Ambulatory Visit (HOSPITAL_COMMUNITY): Payer: Medicare Other | Admitting: Psychiatry

## 2019-04-21 ENCOUNTER — Other Ambulatory Visit: Payer: Self-pay

## 2019-04-21 ENCOUNTER — Ambulatory Visit (INDEPENDENT_AMBULATORY_CARE_PROVIDER_SITE_OTHER): Payer: Medicare Other | Admitting: Psychiatry

## 2019-04-21 DIAGNOSIS — F3341 Major depressive disorder, recurrent, in partial remission: Secondary | ICD-10-CM

## 2019-04-21 DIAGNOSIS — F411 Generalized anxiety disorder: Secondary | ICD-10-CM | POA: Diagnosis not present

## 2019-04-21 MED ORDER — SERTRALINE HCL 100 MG PO TABS: 150.0000 mg | ORAL_TABLET | Freq: Every day | ORAL | 0 refills | Status: DC

## 2019-04-21 MED ORDER — QUETIAPINE FUMARATE 25 MG PO TABS: 75.0000 mg | ORAL_TABLET | Freq: Every day | ORAL | 0 refills | Status: DC

## 2019-04-21 NOTE — Progress Notes (Signed)
[facility] MD/PA/NP OP Progress Note  04/21/2019 11:44 AM Kleigh Hoelzer  MRN:  [id number] Interview was conducted by phone (no access to computer to use WebEx teleconferencing) and I verified that I was speaking with the correct person using two identifiers. I discussed the limitations of evaluation and management by telemedicine and  the availability of in person appointments. Patient expressed understanding and agreed to proceed.  Chief Complaint: Anxiety (mild).  HPI: 68 yo widowed female with GAD and major depression. Mood improving after dose of sertraline was increased to 150 mg but Izora Gala reported more problems with sleep after Seroquel dose was deceased by 25 mg to 50 mg at HS. We increased it back to 75 mg at bedtime. She started in individual therapy yesterday. No SI, no psychosis. She has an apartment rented till December this year but still thinks that she will move back to [place] if her mood stabilizes and anxiety goes away. Supportive son and daughter-in-law locally. Patient will return to clinic in 2.5 months. Dayanis tripped and broke her right wrist three weeks ago - had it surgically repaired and denies having any major pain issues anymore.  Visit Diagnosis:    ICD-10-CM   1. GAD (generalized anxiety disorder) F41.1   2. MDD (major depressive disorder), recurrent, in partial remission ([place]) F33.41     Past Psychiatric History: Please refer to intake H&P.  Past Medical History:  Past Medical History:  Diagnosis Date  . Anxiety   . Depression   . Hypertension 2016    Past Surgical History:  Procedure Laterality Date  . COLON SURGERY  2010   Colon Resection  . COLON SURGERY  2011   Ostomy reversal   . WRIST SURGERY Left     Family Psychiatric History: Reviewed.  Family History:  Family History  Problem Relation Age of Onset  . Anxiety disorder Mother   . Depression Mother   . Alcohol abuse Father   . Anxiety disorder Sister   . Depression Sister   . Anxiety disorder  Maternal Grandmother   . Depression Maternal Grandmother     Social History:  Social History   Socioeconomic History  . Marital status: Single    Spouse name: Not on file  . Number of children: Not on file  . Years of education: Not on file  . Highest education level: Not on file  Occupational History  . Not on file  Social Needs  . Financial resource strain: Not very hard  . Food insecurity:    Worry: Never true    Inability: Never true  . Transportation needs:    Medical: No    Non-medical: No  Tobacco Use  . Smoking status: Never Smoker  . Smokeless tobacco: Never Used  Substance and Sexual Activity  . Alcohol use: Not Currently  . Drug use: Not Currently  . Sexual activity: Not Currently  Lifestyle  . Physical activity:    Days per week: 7 days    Minutes per session: 40 min  . Stress: Very much  Relationships  . Social connections:    Talks on phone: More than three times a week    Gets together: Once a week    Attends religious service: Never    Active member of club or organization: No    Attends meetings of clubs or organizations: Never    Relationship status: Widowed  Other Topics Concern  . Not on file  Social History Narrative  . Not on file  Allergies:  Allergies  Allergen Reactions  . Penicillin V Potassium Other (See Comments)    Reaction happened as a baby- not recalled Did it involve swelling of the face/tongue/throat, SOB, or low BP? Unk Did it involve sudden or severe rash/hives, skin peeling, or any reaction on the inside of your mouth or nose? Unk Did you need to seek medical attention at a hospital or doctor's office? Unk When did it last happen?"I was a baby" If all above answers are "NO", may proceed with cephalosporin use.    Marland Kitchen Penicillins Other (See Comments)    Reaction happened as a baby- not recalled Did it involve swelling of the face/tongue/throat, SOB, or low BP? Unk Did it involve sudden or severe rash/hives, skin  peeling, or any reaction on the inside of your mouth or nose? Unk Did you need to seek medical attention at a hospital or doctor's office? Unk When did it last happen?"I was a baby" If all above answers are "NO", may proceed with cephalosporin use.  . Adhesive [Tape] Rash    Terrible rash developed at incision site    Metabolic Disorder Labs: No results found for: HGBA1C, MPG No results found for: PROLACTIN No results found for: CHOL, TRIG, HDL, CHOLHDL, VLDL, LDLCALC No results found for: TSH  Therapeutic Level Labs: No results found for: LITHIUM No results found for: VALPROATE No components found for:  CBMZ  Current Medications: Current Outpatient Medications  Medication Sig Dispense Refill  . lisinopril (PRINIVIL,ZESTRIL) 20 MG tablet Take 20 mg by mouth daily.    . Multiple Vitamins-Minerals (ONE-A-DAY VITACRAVES ADULT) CHEW Chew 1 tablet by mouth 2 (two) times a day.    . Omega-3 Fatty Acids (FISH OIL PO) Take 1 capsule by mouth daily with breakfast.    . oxyCODONE (ROXICODONE) 5 MG immediate release tablet Take 1 tablet (5 mg total) by mouth every 4 (four) hours as needed for severe pain. 10 tablet 0  . QUEtiapine (SEROQUEL) 25 MG tablet Take 3 tablets (75 mg total) by mouth at bedtime. 270 tablet 0  . sertraline (ZOLOFT) 100 MG tablet Take 1.5 tablets (150 mg total) by mouth daily. 135 tablet 0   No current facility-administered medications for this visit.      Psychiatric Specialty Exam: Review of Systems  Psychiatric/Behavioral: The patient is nervous/anxious.   All other systems reviewed and are negative.   There were no vitals taken for this visit.There is no height or weight on file to calculate BMI.  General Appearance: NA  Eye Contact:  NA  Speech:  Clear and Coherent  Volume:  Normal  Mood:  Anxious  Affect:  NA  Thought Process:  Goal Directed  Orientation:  Full (Time, Place, and Person)  Thought Content: Logical   Suicidal Thoughts:  No  Homicidal  Thoughts:  No  Memory:  Immediate;   Good Recent;   Good Remote;   Good  Judgement:  Intact  Insight:  Good  Psychomotor Activity:  NA  Concentration:  Concentration: Good  Recall:  Good  Fund of Knowledge: Good  Language: Good  Akathisia:  NA  Handed:  Right  AIMS (if indicated): not done  Assets:  Communication Skills Desire for Improvement Housing Leisure Time Resilience Social Support  ADL's:  Intact  Cognition: WNL  Sleep:  Good   Screenings: GAD-7     Counselor from 11/21/2018 in [facility] Counselor from 11/05/2018 in [facility]  Total GAD-7 Score  6  13    PHQ2-9     Counselor from 11/21/2018 in [facility] Most recent reading at 11/21/2018  9:00 AM Counselor from 11/04/2018 in [facility] Most recent reading at 11/04/2018 11:51 AM Counselor from 11/05/2018 in [facility] Most recent reading at 11/04/2018  9:00 AM  PHQ-2 Total Score  2  6  4   PHQ-9 Total Score  8  16  13        Assessment and Plan: 68 yo widowed female with GAD and major depression. Mood improving after dose of sertraline was increased to 150 mg but Izora Gala reported more problems with sleep after Seroquel dose was deceased by 25 mg to 50 mg at HS. We increased it back to 75 mg at bedtime. She started in individual therapy yesterday. No SI, no psychosis. She has an apartment rented till December this year but still thinks that she will move back to [place] if her mood stabilizes and anxiety goes away. Supportive son and daughter-in-law locally. Aarini does not desire to change her meds at this time as her mood and lseep have improved. Patient will return to clinic in 3 months.   Stephanie Acre, MD 04/21/2019, 11:44 AM

## 2019-05-29 ENCOUNTER — Ambulatory Visit: Payer: Medicare Other | Attending: Internal Medicine | Admitting: Internal Medicine

## 2019-05-29 ENCOUNTER — Encounter: Payer: Self-pay | Admitting: Internal Medicine

## 2019-05-29 ENCOUNTER — Other Ambulatory Visit: Payer: Self-pay

## 2019-05-29 VITALS — BP 142/95 | HR 65 | Temp 98.3°F | Resp 16 | Ht 70.0 in | Wt 187.6 lb

## 2019-05-29 DIAGNOSIS — G8929 Other chronic pain: Secondary | ICD-10-CM

## 2019-05-29 DIAGNOSIS — M25511 Pain in right shoulder: Secondary | ICD-10-CM

## 2019-05-29 DIAGNOSIS — I10 Essential (primary) hypertension: Secondary | ICD-10-CM | POA: Insufficient documentation

## 2019-05-29 DIAGNOSIS — G5601 Carpal tunnel syndrome, right upper limb: Secondary | ICD-10-CM

## 2019-05-29 DIAGNOSIS — M25531 Pain in right wrist: Secondary | ICD-10-CM | POA: Diagnosis not present

## 2019-05-29 DIAGNOSIS — Z8781 Personal history of (healed) traumatic fracture: Secondary | ICD-10-CM | POA: Insufficient documentation

## 2019-05-29 MED ORDER — DICLOFENAC SODIUM 1 % TD GEL: 2.0000 g | Freq: Four times a day (QID) | TRANSDERMAL | 1 refills | Status: AC | PRN

## 2019-05-29 NOTE — Progress Notes (Signed)
Patient ID: Ashley Padilla, female    DOB: 06-02-1951  MRN: [id number]  CC: Hospitalization Follow-up (ED)   Subjective: Ashley Padilla is a 68 y.o. female who presents for new pt visit.   Her concerns today include:  Pt with hx of GAD/MDD followed by psychiatry, HTN  Previous PCP was in [place].  Moved [place] end of 11.2019.   Main complaint today is swelling in the wrists and fingers: Patient accidentally tripped over a root of a tree that was sticking up above the concrete sidewalk when she was out for a walk back in April.  Patient sustained a fracture to her right wrist which required surgery with placement of a plate and screws.    Has appt coming up with the ortho surgeon for f/u in a few wks  Since the fall.  She has noticed pain over RT shoulder blade with certain movements.  It has gotten better but it is still rare.  Had neg x rays of the right shoulder at the time of the fall. Since the surgery on her wrist, she has noted that the last 4 fingers on the right hand gets numb intermittently.  She has had swelling of the wrist and these fingers since the surgery.  It gets a little better when she applies ice on the palmar surface of the wrist.  Swelling is worse when walking and first thing in mornings.  She has a wrist splint which she wears when she drives.  The right hand is weaker compared to the left but this is gotten better with home exercises  She gives history of hypertension for which he is on lisinopril.  She limits salt in the foods.  Denies any chest pains or shortness of breath.  She walks several times a week for exercise. No lower extremity edema.  She has history of partial colectomy that was done due to a recurrent polyp that was thought to be cancerous.  However patient states that final pathology turned out that it was not cancerous. She thinks her last colonoscopy was back in 2017.  HM:  Has vaccine record with her, last MMG 2018, las c-scope 2017, BMD was 2 yrs  ago and was nl per her report.  She no longer has to do Pap smears  Past family history, social history, surgical history reviewed and updated.  Patient Active Problem List   Diagnosis Date Noted  . MDD (major depressive disorder), recurrent, in partial remission ([place]) 01/15/2019  . GAD (generalized anxiety disorder) 01/15/2019     Current Outpatient Medications on File Prior to Visit  Medication Sig Dispense Refill  . lisinopril (PRINIVIL,ZESTRIL) 20 MG tablet Take 20 mg by mouth daily.    . Multiple Vitamins-Minerals (ONE-A-DAY VITACRAVES ADULT) CHEW Chew 1 tablet by mouth 2 (two) times a day.    . Omega-3 Fatty Acids (FISH OIL PO) Take 1 capsule by mouth daily with breakfast.    . QUEtiapine (SEROQUEL) 25 MG tablet Take 3 tablets (75 mg total) by mouth at bedtime. 270 tablet 0  . sertraline (ZOLOFT) 100 MG tablet Take 1.5 tablets (150 mg total) by mouth daily. 135 tablet 0   No current facility-administered medications on file prior to visit.     Allergies  Allergen Reactions  . Penicillin V Potassium Other (See Comments)    Reaction happened as a baby- not recalled Did it involve swelling of the face/tongue/throat, SOB, or low BP? Unk Did it involve sudden or severe rash/hives, skin  peeling, or any reaction on the inside of your mouth or nose? Unk Did you need to seek medical attention at a hospital or doctor's office? Unk When did it last happen?"I was a baby" If all above answers are "NO", may proceed with cephalosporin use.    Marland Kitchen Penicillins Other (See Comments)    Reaction happened as a baby- not recalled Did it involve swelling of the face/tongue/throat, SOB, or low BP? Unk Did it involve sudden or severe rash/hives, skin peeling, or any reaction on the inside of your mouth or nose? Unk Did you need to seek medical attention at a hospital or doctor's office? Unk When did it last happen?"I was a baby" If all above answers are "NO", may proceed with cephalosporin use.   . Adhesive [Tape] Rash    Terrible rash developed at incision site    Social History   Socioeconomic History  . Marital status: Widowed    Spouse name: Not on file  . Number of children: 3  . Years of education: BS degree  . Highest education level: Not on file  Occupational History  . Occupation: retired Tour manager  . Financial resource strain: Not very hard  . Food insecurity    Worry: Never true    Inability: Never true  . Transportation needs    Medical: No    Non-medical: No  Tobacco Use  . Smoking status: Never Smoker  . Smokeless tobacco: Never Used  Substance and Sexual Activity  . Alcohol use: Not Currently  . Drug use: Not Currently  . Sexual activity: Not Currently  Lifestyle  . Physical activity    Days per week: 7 days    Minutes per session: 40 min  . Stress: Very much  Relationships  . Social connections    Talks on phone: More than three times a week    Gets together: Once a week    Attends religious service: Never    Active member of club or organization: No    Attends meetings of clubs or organizations: Never    Relationship status: Widowed  . Intimate partner violence    Fear of current or ex partner: No    Emotionally abused: No    Physically abused: No    Forced sexual activity: No  Other Topics Concern  . Not on file  Social History Narrative  . Not on file    Family History  Problem Relation Age of Onset  . Anxiety disorder Mother   . Depression Mother   . Alcohol abuse Father   . Anxiety disorder Sister   . Depression Sister   . Anxiety disorder Maternal Grandmother   . Depression Maternal Grandmother     Past Surgical History:  Procedure Laterality Date  . COLON RESECTION N/A 11/2009   for presumed cancerous polyp that turned out not to be CA  . COLON SURGERY  2010   Colon Resection  . COLON SURGERY  2011   Ostomy reversal   . polypectomy uterous  2012  . WRIST FRACTURE SURGERY Bilateral 2018, 03/2019   LT  2018 and RT 2020  . WRIST SURGERY Left     ROS: Review of Systems Negative except as stated above  PHYSICAL EXAM: BP (!) 142/95   Pulse 65   Temp 98.3 F (36.8 C) (Oral)   Resp 16   Ht 5\' 10"  (1.778 m)   Wt 187 lb 9.6 oz (85.1 kg)   SpO2 98%   BMI  26.92 kg/m   Physical Exam  General appearance - alert, well appearing, pleasant elderly Caucasian female and in no distress Mental status - normal mood, behavior, speech, dress, motor activity, and thought processes Neck - supple, no significant adenopathy Lymphatics -no cervical axillary lymphadenopathy Chest - clear to auscultation, no wheezes, rales or rhonchi, symmetric air entry Heart - normal rate, regular rhythm, normal S1, S2, no murmurs, rubs, clicks or gallops Neurological -grip 5 out of 5 on the left, 4+/5 on the right.  Gross sensation intact in both hands. Musculoskeletal -mild swelling of the right wrist and of the second through the fifth fingers. Neck is supple.  Slight tenderness along the medial edge of the right scapula.  Good range of motion of the right glenohumeral joint Extremities -no lower extremity edema  ASSESSMENT AND PLAN:  1. Wrist pain, chronic, right She may have a little bit of carpal tunnel syndrome as a result of the fracture and the type of surgery that she had.  Encouraged her to wear the wrist splint at nights also.  Use warm compression around the wrist intermittently.  Also given a prescription for some Voltaren gel. - diclofenac sodium (VOLTAREN) 1 % GEL; Apply 2 g topically 4 (four) times daily as needed.  Dispense: 100 g; Refill: 1  2. Carpal tunnel syndrome of right wrist See #1 above  3. Acute pain of right shoulder X-ray revealed no fracture.  Recommend Voltaren gel as needed - diclofenac sodium (VOLTAREN) 1 % GEL; Apply 2 g topically 4 (four) times daily as needed.  Dispense: 100 g; Refill: 1  4. Essential hypertension Repeat blood pressure today is better but not at goal.   Advised patient to check blood pressure at home at least about twice a week and write down her numbers.  She will come in to see a clinical pharmacist in about 3 weeks and bring those readings in with her.  If not at goal we can add low-dose of Norvasc or HCTZ. She will come fasting on that visit to stop at the lab for blood test - CBC; Future - Comprehensive metabolic panel; Future - Lipid panel; Future  5. History of wrist fracture - VITAMIN D 25 Hydroxy (Vit-D Deficiency, Fractures); Future   30 minutes spent with patient in direct face-to-face evaluation discussing symptoms, diagnosis, treatment and coordinating care. Patient was given the opportunity to ask questions.  Patient verbalized understanding of the plan and was able to repeat key elements of the plan.   Orders Placed This Encounter  Procedures  . CBC  . Comprehensive metabolic panel  . Lipid panel  . VITAMIN D 25 Hydroxy (Vit-D Deficiency, Fractures)     Requested Prescriptions   Signed Prescriptions Disp Refills  . diclofenac sodium (VOLTAREN) 1 % GEL 100 g 1    Sig: Apply 2 g topically 4 (four) times daily as needed.    Return in about 3 months (around 08/29/2019).  Karle Plumber, MD, FACP

## 2019-05-29 NOTE — Patient Instructions (Signed)
Please give patient an appointment with our clinical pharmacist in 3 weeks for recheck of blood pressure.  Please remember to come fasting to the lab on the same day that you come to see the clinical pharmacist for recheck of your blood pressure.  Try to check your blood pressure at least twice a week.  The goal is 130/80 or lower.  Please sign a release for [place] to get your medical records from your previous PCP.  Please also sign a release for [place] to get the results of your last colonoscopy report.

## 2019-05-29 NOTE — Progress Notes (Signed)
Pt states she fell back in April and when she did she broke her wrist  Pt states her hand is still swollen and fingers goes numb

## 2019-06-12 ENCOUNTER — Emergency Department (HOSPITAL_COMMUNITY)
Admission: EM | Admit: 2019-06-12 | Discharge: 2019-06-12 | Disposition: A | Discharge status: Home / Self Care | Payer: Medicare Other | Attending: Emergency Medicine | Admitting: Emergency Medicine

## 2019-06-12 ENCOUNTER — Encounter (HOSPITAL_COMMUNITY): Payer: Self-pay | Admitting: Emergency Medicine

## 2019-06-12 ENCOUNTER — Emergency Department (HOSPITAL_COMMUNITY): Payer: Medicare Other

## 2019-06-12 DIAGNOSIS — I10 Essential (primary) hypertension: Secondary | ICD-10-CM | POA: Diagnosis not present

## 2019-06-12 DIAGNOSIS — S42295A Other nondisplaced fracture of upper end of left humerus, initial encounter for closed fracture: Secondary | ICD-10-CM | POA: Insufficient documentation

## 2019-06-12 DIAGNOSIS — Y999 Unspecified external cause status: Secondary | ICD-10-CM | POA: Insufficient documentation

## 2019-06-12 DIAGNOSIS — S50312A Abrasion of left elbow, initial encounter: Secondary | ICD-10-CM | POA: Insufficient documentation

## 2019-06-12 DIAGNOSIS — S80212A Abrasion, left knee, initial encounter: Secondary | ICD-10-CM | POA: Insufficient documentation

## 2019-06-12 DIAGNOSIS — Y92013 Bedroom of single-family (private) house as the place of occurrence of the external cause: Secondary | ICD-10-CM | POA: Diagnosis not present

## 2019-06-12 DIAGNOSIS — Z79899 Other long term (current) drug therapy: Secondary | ICD-10-CM | POA: Insufficient documentation

## 2019-06-12 DIAGNOSIS — Y9389 Activity, other specified: Secondary | ICD-10-CM | POA: Diagnosis not present

## 2019-06-12 DIAGNOSIS — W010XXA Fall on same level from slipping, tripping and stumbling without subsequent striking against object, initial encounter: Secondary | ICD-10-CM | POA: Insufficient documentation

## 2019-06-12 DIAGNOSIS — S4992XA Unspecified injury of left shoulder and upper arm, initial encounter: Secondary | ICD-10-CM | POA: Diagnosis present

## 2019-06-12 DIAGNOSIS — S42292A Other displaced fracture of upper end of left humerus, initial encounter for closed fracture: Secondary | ICD-10-CM

## 2019-06-12 MED ORDER — HYDROMORPHONE HCL 1 MG/ML IJ SOLN
0.5000 mg | Freq: Once | INTRAMUSCULAR | Status: AC
  Administered 2019-06-12: 0.5 mg via INTRAMUSCULAR
  Filled 2019-06-12: qty 1

## 2019-06-12 MED ORDER — HYDROCODONE-ACETAMINOPHEN 5-325 MG PO TABS: 1.0000 | ORAL_TABLET | Freq: Four times a day (QID) | ORAL | 0 refills | Status: DC | PRN

## 2019-06-12 NOTE — Discharge Instructions (Signed)
1. Medications: You can take 400 to 600 mg of ibuprofen with food every 6 hours as needed for pain.  You can also alternate with 1 to 2 tablets of extra strength Tylenol every 6 hours.  Do not take more than 4000 mg of Tylenol daily.  If your pain is very severe you can take hydrocodone as needed but be aware this medication can make you drowsy.  Do not drive, drink alcohol, or operate heavy machinery if you are going to take this medication.  Also be aware that it contains Tylenol.  It can also cause constipation so take with a stool softener. 2. Treatment: Wear the shoulder sling at all times until follow up with orthopedist. 3. Follow Up: Please followup with orthopedics within 1 to 2 weeks for reevaluation of symptoms; call today to make an appointment and tell them the type of fracture you have.  Please return to the ER for worsening symptoms or other concerns such as worsening swelling, redness of the skin, fevers, loss of pulses, or loss of feeling

## 2019-06-12 NOTE — ED Triage Notes (Signed)
Pt tripped on foot of the bed- taking med that makes her groggy in morning. Cannot move left shoulder bc of pain. Broken right wrist. Pt has strong pulse.

## 2019-06-12 NOTE — ED Provider Notes (Signed)
[facility] EMERGENCY DEPARTMENT Provider Note   CSN: [id number] Arrival date & time: 06/12/19  2423    History   Chief Complaint Chief Complaint  Patient presents with  . Fall    HPI Ashley Padilla is a 68 y.o. female with history of anxiety, depression, hypertension presenting for evaluation of acute onset, persistent left shoulder pain secondary to mechanical fall.  She reports that she takes Seroquel which makes her groggy in the mornings.  This morning at around 7:30 AM she stood up to open her blinds and accidentally tripped on the bedpost, landing on her left side with both arms outstretched.  No prodrome leading up to the fall including lightheadedness, shortness of breath, or chest pain.  Denies head injury or loss of consciousness.  No headache, vision changes, or vomiting.  She reports acute onset of left shoulder pain which is a 5/10 in severity at rest but worsens with any attempts to move the upper extremity.  Also notes mild elbow pain.  Denies numbness.  No neck pain.  She is not on blood thinners.  She took ibuprofen prior to arrival with minimal relief of symptoms.  Has a history of right wrist fracture secondary to mechanical fall in April and reports chronic pain associated with this but no worsening after her fall.  Her tetanus is up-to-date.     The history is provided by the patient.    Past Medical History:  Diagnosis Date  . Anxiety   . Depression   . Hypertension 2016    Patient Active Problem List   Diagnosis Date Noted  . Wrist pain, chronic, right 05/29/2019  . Acute pain of right shoulder 05/29/2019  . Essential hypertension 05/29/2019  . History of wrist fracture 05/29/2019  . MDD (major depressive disorder), recurrent, in partial remission ([place]) 01/15/2019  . GAD (generalized anxiety disorder) 01/15/2019    Past Surgical History:  Procedure Laterality Date  . COLON RESECTION N/A 11/2009   for presumed cancerous polyp that turned  out not to be CA  . COLON SURGERY  2010   Colon Resection  . COLON SURGERY  2011   Ostomy reversal   . polypectomy uterous  2012  . WRIST FRACTURE SURGERY Bilateral 2018, 03/2019   LT 2018 and RT 2020  . WRIST SURGERY Left      OB History   No obstetric history on file.      Home Medications    Prior to Admission medications   Medication Sig Start Date End Date Taking? Authorizing Provider  diclofenac sodium (VOLTAREN) 1 % GEL Apply 2 g topically 4 (four) times daily as needed. 05/29/19   Ladell Pier, MD  HYDROcodone-acetaminophen (NORCO/VICODIN) 5-325 MG tablet Take 1 tablet by mouth every 6 (six) hours as needed for severe pain. 06/12/19   Nathania Waldman A, PA-C  lisinopril (PRINIVIL,ZESTRIL) 20 MG tablet Take 20 mg by mouth daily.    [provider]  Multiple Vitamins-Minerals (ONE-A-DAY VITACRAVES ADULT) CHEW Chew 1 tablet by mouth 2 (two) times a day.    [provider]  Omega-3 Fatty Acids (FISH OIL PO) Take 1 capsule by mouth daily with breakfast.    [provider]  QUEtiapine (SEROQUEL) 25 MG tablet Take 3 tablets (75 mg total) by mouth at bedtime. 04/21/19 07/20/19  Pucilowski, Marchia Bond, MD  sertraline (ZOLOFT) 100 MG tablet Take 1.5 tablets (150 mg total) by mouth daily. 04/21/19 07/20/19  Pucilowski, Marchia Bond, MD    Family  History Family History  Problem Relation Age of Onset  . Anxiety disorder Mother   . Depression Mother   . Alcohol abuse Father   . Anxiety disorder Sister   . Depression Sister   . Anxiety disorder Maternal Grandmother   . Depression Maternal Grandmother     Social History Social History   Tobacco Use  . Smoking status: Never Smoker  . Smokeless tobacco: Never Used  Substance Use Topics  . Alcohol use: Not Currently  . Drug use: Not Currently     Allergies   Penicillin v potassium, Penicillins, and Adhesive [tape]   Review of Systems Review of Systems  Constitutional: Negative for chills and fever.   Eyes: Negative for visual disturbance.  Respiratory: Negative for shortness of breath.   Cardiovascular: Negative for chest pain.  Gastrointestinal: Positive for nausea. Negative for abdominal pain and vomiting.  Musculoskeletal: Positive for arthralgias.  Neurological: Negative for numbness and headaches.  All other systems reviewed and are negative.    Physical Exam Updated Vital Signs BP 137/84   Pulse 74   Temp 97.8 F (36.6 C) (Oral)   Resp 17   SpO2 97%   Physical Exam Vitals signs and nursing note reviewed.  Constitutional:      General: She is not in acute distress.    Appearance: She is well-developed.  HENT:     Head: Normocephalic and atraumatic.  Eyes:     General:        Right eye: No discharge.        Left eye: No discharge.     Conjunctiva/sclera: Conjunctivae normal.  Neck:     Musculoskeletal: Normal range of motion and neck supple.     Vascular: No JVD.     Trachea: No tracheal deviation.     Comments: No midline cervical spine tenderness to palpation, left paracervical muscle spasm noted.  No deformity, crepitus, or step-off.  Cardiovascular:     Rate and Rhythm: Normal rate and regular rhythm.     Pulses: Normal pulses.     Comments: 2+ radial pulses bilaterally Pulmonary:     Effort: Pulmonary effort is normal.     Breath sounds: Normal breath sounds.  Abdominal:     General: There is no distension.  Musculoskeletal:        General: Tenderness present.     Comments: Left clavicle appears normal with no tenting or deformity.  She has mild tenderness to palpation of the left shoulder anteriorly and tenderness to palpation of the left elbow along the olecranon process.  Limited examination of the left upper extremity due to pain and she is unable to fully abduct or range the left shoulder.  Good grip strength bilaterally.  Otherwise 5/5 strength of right upper extremity major muscle groups  Skin:    General: Skin is warm and dry.     Findings: No  erythema.     Comments: Superficial abrasion to the left knee and left elbow, bleeding controlled.  Neurological:     Mental Status: She is alert.     Comments: Fluent speech with no evidence of dysarthria or aphasia, no facial droop.  Cranial nerves II through XII appear grossly intact. Sensation intact to soft touch of bilateral upper extremities.  Psychiatric:        Behavior: Behavior normal.      ED Treatments / Results  Labs (all labs ordered are listed, but only abnormal results are displayed) Labs Reviewed - No data to display  EKG None  Radiology Dg Elbow Complete Left  Result Date: 06/12/2019 CLINICAL DATA:  [place].  Left elbow pain. EXAM: LEFT ELBOW - COMPLETE 3+ VIEW COMPARISON:  None. FINDINGS: Mild elbow joint degenerative changes but no fracture, osteochondral lesion or joint effusion. IMPRESSION: No acute elbow fracture or joint effusion. Electronically Signed   By: Marijo Sanes M.D.   On: 06/12/2019 09:45   Dg Shoulder Left  Result Date: 06/12/2019 CLINICAL DATA:  [place] while getting out of bed this morning and injured left shoulder. EXAM: LEFT SHOULDER - 2+ VIEW COMPARISON:  None. FINDINGS: Mildly impacted but nondisplaced fracture suspected through the humeral neck. There may also be a nondisplaced longitudinal fracture through the greater tuberosity. The humeral head is normally located. The visualized left ribs are intact and the left lung is clear. The [facility] joint is maintained. IMPRESSION: Nondisplaced humeral head and neck fractures. Electronically Signed   By: Marijo Sanes M.D.   On: 06/12/2019 09:44    Procedures Procedures (including critical care time)  Medications Ordered in ED Medications  HYDROmorphone (DILAUDID) injection 0.5 mg (0.5 mg Intramuscular Given 06/12/19 1000)     Initial Impression / Assessment and Plan / ED Course  I have reviewed the triage vital signs and the nursing notes.  Pertinent labs & imaging results that were available during my  care of the patient were reviewed by me and considered in my medical decision making (see chart for details).       Patient with left shoulder pain secondary to mechanical fall just prior to arrival.  She is afebrile, vital signs are stable.  She is nontoxic in appearance.  She is neurovascularly intact.  No head injury, she is not anticoagulated, no signs of serious head injury on exam.  Radiographs significant for nondisplaced humeral head and neck fractures.  Pain managed in the ED.  He has seen Dr. Lenon Curt in the past for wrist fracture repair but will need to follow-up with an orthopedist for this as Dr. Lenon Curt only operates on hands.  She was placed in a shoulder sling.  No evidence of compartment syndrome.  Remains neurovascularly intact on reassessment.  Will discharge with small amount of hydrocodone for pain control, advised of side effects.  Discussed strict ED return precautions.  Patient verbalized understanding of and agreement with plan and patient stable for discharge home at this time.   Final Clinical Impressions(s) / ED Diagnoses   Final diagnoses:  Closed fracture of head of left humerus, initial encounter    ED Discharge Orders         Ordered    HYDROcodone-acetaminophen (NORCO/VICODIN) 5-325 MG tablet  Every 6 hours PRN     06/12/19 [address], Rico A, PA-C 06/12/19 1032    Lacretia Leigh, MD 06/14/19 [phone]

## 2019-06-24 ENCOUNTER — Other Ambulatory Visit: Payer: Self-pay

## 2019-06-24 ENCOUNTER — Encounter: Payer: Self-pay | Admitting: Pharmacist

## 2019-06-24 ENCOUNTER — Ambulatory Visit: Payer: Medicare Other | Attending: Internal Medicine | Admitting: Pharmacist

## 2019-06-24 VITALS — BP 142/94 | HR 73

## 2019-06-24 DIAGNOSIS — I10 Essential (primary) hypertension: Secondary | ICD-10-CM

## 2019-06-24 DIAGNOSIS — Z8781 Personal history of (healed) traumatic fracture: Secondary | ICD-10-CM

## 2019-06-24 MED ORDER — AMLODIPINE BESYLATE 5 MG PO TABS: 5.0000 mg | ORAL_TABLET | Freq: Every day | ORAL | 0 refills | Status: DC

## 2019-06-24 NOTE — Patient Instructions (Signed)
Thank you for coming to see [place] today.   Blood pressure today is elevated.   We will add amlodipine to your regimen. Take one 5 mg tablet in the evening with your lisinopril.  Limiting salt and caffeine, as well as exercising as able for at least 30 minutes for 5 days out of the week, can also help you lower your blood pressure.  Take your blood pressure at home if you are able. Please write down these numbers and bring them to your visits.  If you have any questions about medications, please call me [phone].  Lurena Joiner

## 2019-06-24 NOTE — Progress Notes (Signed)
   S:    PCP: Dr. Wynetta Emery  Patient arrives in good spirits. Presents to the clinic for BP check at the request of Dr. Wynetta Emery. Patient was referred and last seen by PCP on 05/29/19. BP at that visit 142/95. Of note, pt sustained closed fracture of head of left humerus on 06/12/19. Seen by Ortho on 06/19/19 who is managing. Pt took ibuprofen ~0700 this AM.   Patient reports adherence with medications. Denies chest pain, shortness of breath, HA or blurred vision.   Current BP Medications include:  Lisinopril 20 mg daily (takes at night)  Antihypertensives tried in the past include: none other then lisinopri  Dietary habits include: follows a DASH diet; drinks sweat tea but limits to when she eats out Exercise habits include: limited d/t recent fracture but did walk regularly before her accident Family / Social history: - FHX:  Negative for HTN - never smoker - denies using alcohol  O:  Home BP readings:  - Brings in range: 122-138/79-85  Last 3 Office BP readings: BP Readings from Last 3 Encounters:  06/24/19 (!) 142/94  06/12/19 137/84  05/29/19 (!) 142/95    BMET No results found for: NA, K, CL, CO2, GLUCOSE, BUN, CREATININE, CALCIUM, GFRNONAA, GFRAA  Renal function: CrCl cannot be calculated (No successful lab value found.).  Clinical ASCVD: No  The ASCVD Risk score Mikey Bussing DC Jr., et al., 2013) failed to calculate for the following reasons:   Cannot find a previous HDL lab   Cannot find a previous total cholesterol lab  A/P: Hypertension longstanding currently above goal on current medications. BP Goal = <130/80 mmHg. Patient is adherent with current medications. Her home pressures are better but some are still above goal. Will add low-dose amlodipine to regimen. -Continued lisinopril.  -Add amlodipine 5 mg daily  -F/u labs ordered - CBC, CMP, Lipid, Vit D-OH per PCP -Counseled on lifestyle modifications for blood pressure control including reduced dietary sodium, increased  exercise, adequate sleep -HM: Prevnar >1 yr ago. Pneumovax due. Will defer to PCP.   Results reviewed and written information provided. Total time in face-to-face counseling 15 minutes.   F/U Clinic Visit in 1 month.    Benard Halsted, PharmD, [place] [phone]

## 2019-06-25 ENCOUNTER — Encounter: Payer: Self-pay | Admitting: Internal Medicine

## 2019-06-25 DIAGNOSIS — E782 Mixed hyperlipidemia: Secondary | ICD-10-CM | POA: Insufficient documentation

## 2019-06-25 DIAGNOSIS — E559 Vitamin D deficiency, unspecified: Secondary | ICD-10-CM | POA: Insufficient documentation

## 2019-06-25 LAB — COMPREHENSIVE METABOLIC PANEL
ALT: 20 IU/L (ref 0–32)
AST: 15 IU/L (ref 0–40)
Albumin/Globulin Ratio: 1.9 (ref 1.2–2.2)
Albumin: 4.3 g/dL (ref 3.8–4.8)
Alkaline Phosphatase: 70 IU/L (ref 39–117)
BUN/Creatinine Ratio: 15 (ref 12–28)
BUN: 13 mg/dL (ref 8–27)
Bilirubin Total: 0.6 mg/dL (ref 0.0–1.2)
CO2: 23 mmol/L (ref 20–29)
Calcium: 9.2 mg/dL (ref 8.7–10.3)
Chloride: 105 mmol/L (ref 96–106)
Creatinine, Ser: 0.89 mg/dL (ref 0.57–1.00)
GFR calc Af Amer: 78 mL/min/{1.73_m2} (ref >59)
GFR calc non Af Amer: 67 mL/min/{1.73_m2} (ref >59)
Globulin, Total: 2.3 g/dL (ref 1.5–4.5)
Glucose: 92 mg/dL (ref 65–99)
Potassium: 4 mmol/L (ref 3.5–5.2)
Sodium: 145 mmol/L — ABNORMAL HIGH (ref 134–144)
Total Protein: 6.6 g/dL (ref 6.0–8.5)

## 2019-06-25 LAB — CBC
Hematocrit: 40.2 % (ref 34.0–46.6)
Hemoglobin: 13.2 g/dL (ref 11.1–15.9)
MCH: 29.3 pg (ref 26.6–33.0)
MCHC: 32.8 g/dL (ref 31.5–35.7)
MCV: 89 fL (ref 79–97)
Platelets: 198 10*3/uL (ref 150–450)
RBC: 4.5 x10E6/uL (ref 3.77–5.28)
RDW: 14.6 % (ref 11.7–15.4)
WBC: 6 10*3/uL (ref 3.4–10.8)

## 2019-06-25 LAB — LIPID PANEL
Chol/HDL Ratio: 4.4 ratio (ref 0.0–4.4)
Cholesterol, Total: 216 mg/dL — ABNORMAL HIGH (ref 100–199)
HDL: 49 mg/dL (ref >39)
LDL Calculated: 134 mg/dL — ABNORMAL HIGH (ref 0–99)
Triglycerides: 165 mg/dL — ABNORMAL HIGH (ref 0–149)
VLDL Cholesterol Cal: 33 mg/dL (ref 5–40)

## 2019-06-25 LAB — VITAMIN D 25 HYDROXY (VIT D DEFICIENCY, FRACTURES): Vit D, 25-Hydroxy: 20.1 ng/mL — ABNORMAL LOW (ref 30.0–100.0)

## 2019-07-08 ENCOUNTER — Encounter: Payer: Self-pay | Admitting: Internal Medicine

## 2019-07-08 NOTE — Progress Notes (Signed)
Received some records from [facility].  Last MMG was 01/14/2018 showingscattered fibroglandular opacity BL, benign Ca+, small unchanged BL breast nodules.  No suspicious masses.  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

## 2019-07-11 ENCOUNTER — Other Ambulatory Visit (HOSPITAL_COMMUNITY): Payer: Self-pay | Admitting: Psychiatry

## 2019-07-12 ENCOUNTER — Other Ambulatory Visit (HOSPITAL_COMMUNITY): Payer: Self-pay | Admitting: Psychiatry

## 2019-07-21 ENCOUNTER — Other Ambulatory Visit: Payer: Self-pay

## 2019-07-21 ENCOUNTER — Ambulatory Visit (INDEPENDENT_AMBULATORY_CARE_PROVIDER_SITE_OTHER): Payer: Medicare Other | Admitting: Psychiatry

## 2019-07-21 DIAGNOSIS — F3342 Major depressive disorder, recurrent, in full remission: Secondary | ICD-10-CM

## 2019-07-21 DIAGNOSIS — F411 Generalized anxiety disorder: Secondary | ICD-10-CM

## 2019-07-21 MED ORDER — SERTRALINE HCL 100 MG PO TABS: ORAL_TABLET | ORAL | 0 refills | Status: DC

## 2019-07-21 NOTE — Progress Notes (Signed)
BH MD/PA/NP OP Progress Note  07/21/2019 11:17 AM Ashley Padilla  MRN:  [id number] Interview was conducted by phone and I verified that I was speaking with the correct person using two identifiers. I discussed the limitations of evaluation and management by telemedicine and  the availability of in person appointments. Patient expressed understanding and agreed to proceed.  Chief Complaint: "I am doing well".  HPI: 68 yo widowed female withGAD and major depression. Mood normalized after dose of sertraline was increased to 150 mg but Ashley Padilla reported more problems with sleep after Seroquel dose was deceased by 25 mg to 50 mg at HS. We increased it back to 75 mg at bedtime but now wants to try decreasing it again; this time to 50 mg. She started in individual therapy with Dr. Baldo Daub - they talk on weekly basis. No SI, no psychosis. She has an apartment rented till December this year but still thinks that she will move back to [place] if her mood stabilizes and anxiety goes away. Supportive son and daughter-in-law locally. Ashley Padilla does not desire to change her sertraline as her mood and sleep have improved. Patient will return to clinic in 3 months.  Visit Diagnosis:    ICD-10-CM   1. GAD (generalized anxiety disorder)  F41.1   2. Major depressive disorder, recurrent episode, in full remission ([place])  F33.42     Past Psychiatric History: Please see intake H&P.  Past Medical History:  Past Medical History:  Diagnosis Date  . Anxiety   . Depression   . Hypertension 2016    Past Surgical History:  Procedure Laterality Date  . COLON RESECTION N/A 11/2009   for presumed cancerous polyp that turned out not to be CA  . COLON SURGERY  2010   Colon Resection  . COLON SURGERY  2011   Ostomy reversal   . polypectomy uterous  2012  . WRIST FRACTURE SURGERY Bilateral 2018, 03/2019   LT 2018 and RT 2020  . WRIST SURGERY Left     Family Psychiatric History: Reviewed.  Family History:  Family History   Problem Relation Age of Onset  . Anxiety disorder Mother   . Depression Mother   . Alcohol abuse Father   . Anxiety disorder Sister   . Depression Sister   . Anxiety disorder Maternal Grandmother   . Depression Maternal Grandmother     Social History:  Social History   Socioeconomic History  . Marital status: Widowed    Spouse name: Not on file  . Number of children: 3  . Years of education: BS degree  . Highest education level: Not on file  Occupational History  . Occupation: retired Tour manager  . Financial resource strain: Not very hard  . Food insecurity    Worry: Never true    Inability: Never true  . Transportation needs    Medical: No    Non-medical: No  Tobacco Use  . Smoking status: Never Smoker  . Smokeless tobacco: Never Used  Substance and Sexual Activity  . Alcohol use: Not Currently  . Drug use: Not Currently  . Sexual activity: Not Currently  Lifestyle  . Physical activity    Days per week: 7 days    Minutes per session: 40 min  . Stress: Very much  Relationships  . Social connections    Talks on phone: More than three times a week    Gets together: Once a week    Attends religious service: Never  Active member of club or organization: No    Attends meetings of clubs or organizations: Never    Relationship status: Widowed  Other Topics Concern  . Not on file  Social History Narrative  . Not on file    Allergies:  Allergies  Allergen Reactions  . Penicillin V Potassium Other (See Comments)    Reaction happened as a baby- not recalled Did it involve swelling of the face/tongue/throat, SOB, or low BP? Unk Did it involve sudden or severe rash/hives, skin peeling, or any reaction on the inside of your mouth or nose? Unk Did you need to seek medical attention at a hospital or doctor's office? Unk When did it last happen?"I was a baby" If all above answers are "NO", may proceed with cephalosporin use.    Marland Kitchen Penicillins Other  (See Comments)    Reaction happened as a baby- not recalled Did it involve swelling of the face/tongue/throat, SOB, or low BP? Unk Did it involve sudden or severe rash/hives, skin peeling, or any reaction on the inside of your mouth or nose? Unk Did you need to seek medical attention at a hospital or doctor's office? Unk When did it last happen?"I was a baby" If all above answers are "NO", may proceed with cephalosporin use.  . Latex   . Adhesive [Tape] Rash    Terrible rash developed at incision site    Metabolic Disorder Labs: No results found for: HGBA1C, MPG No results found for: PROLACTIN Lab Results  Component Value Date   CHOL 216 (H) 06/24/2019   TRIG 165 (H) 06/24/2019   HDL 49 06/24/2019   CHOLHDL 4.4 06/24/2019   LDLCALC 134 (H) 06/24/2019   No results found for: TSH  Therapeutic Level Labs: No results found for: LITHIUM No results found for: VALPROATE No components found for:  CBMZ  Current Medications: Current Outpatient Medications  Medication Sig Dispense Refill  . amLODipine (NORVASC) 5 MG tablet Take 1 tablet (5 mg total) by mouth daily. 90 tablet 0  . diclofenac sodium (VOLTAREN) 1 % GEL Apply 2 g topically 4 (four) times daily as needed. 100 g 1  . HYDROcodone-acetaminophen (NORCO/VICODIN) 5-325 MG tablet Take 1 tablet by mouth every 6 (six) hours as needed for severe pain. 8 tablet 0  . lisinopril (PRINIVIL,ZESTRIL) 20 MG tablet Take 20 mg by mouth daily.    . Multiple Vitamins-Minerals (ONE-A-DAY VITACRAVES ADULT) CHEW Chew 1 tablet by mouth 2 (two) times a day.    . Omega-3 Fatty Acids (FISH OIL PO) Take 1 capsule by mouth daily with breakfast.    . QUEtiapine (SEROQUEL) 25 MG tablet TAKE 3 TABLETS (75 MG TOTAL) BY MOUTH AT BEDTIME. 270 tablet 0  . sertraline (ZOLOFT) 100 MG tablet TAKE 1.5 TABLETS BY MOUTH DAILY 135 tablet 0   No current facility-administered medications for this visit.       Psychiatric Specialty Exam: Review of Systems   Musculoskeletal:       Left wrist pain.    There were no vitals taken for this visit.There is no height or weight on file to calculate BMI.  General Appearance: NA  Eye Contact:  NA  Speech:  Clear and Coherent and Normal Rate  Volume:  Normal  Mood:  Euthymic  Affect:  NA  Thought Process:  Goal Directed  Orientation:  Full (Time, Place, and Person)  Thought Content: Logical   Suicidal Thoughts:  No  Homicidal Thoughts:  No  Memory:  Immediate;   Good Recent;  Good Remote;   Good  Judgement:  Good  Insight:  Good  Psychomotor Activity:  NA  Concentration:  Concentration: Good and Attention Span: Good  Recall:  Good  Fund of Knowledge: Good  Language: Good  Akathisia:  Negative  Handed:  Right  AIMS (if indicated): not done  Assets:  Communication Skills Desire for Improvement Financial Resources/Insurance Housing Resilience Social Support Talents/Skills  ADL's:  Intact  Cognition: WNL  Sleep:  Good   Screenings: GAD-7     Office Visit from 05/29/2019 in [facility] Counselor from 11/21/2018 in [facility] Counselor from 11/05/2018 in [facility]  Total GAD-7 Score  4  6  13     PHQ2-9     Office Visit from 05/29/2019 in [facility] Most recent reading at 05/29/2019 10:33 AM Counselor from 11/21/2018 in [facility] Most recent reading at 11/21/2018  9:00 AM Counselor from 11/04/2018 in [facility] Most recent reading at 11/04/2018 11:51 AM Counselor from 11/05/2018 in [facility] Most recent reading at 11/04/2018  9:00 AM  PHQ-2 Total Score  2  2  6  4   PHQ-9 Total Score  5  8  16  13        Assessment and Plan: 68 yo widowed female withGAD and major depression. Mood normalized after dose of sertraline was increased  to 150 mg but Ashley Padilla reported more problems with sleep after Seroquel dose was deceased by 25 mg to 50 mg at HS. We increased it back to 75 mg at bedtime but now wants to try decreasing it again; this time to 50 mg. She started in individual therapy with Dr. Baldo Daub - they talk on weekly basis. No SI, no psychosis. She has an apartment rented till December this year but still thinks that she will move back to [place] if her mood stabilizes and anxiety goes away. Supportive son and daughter-in-law locally. Ashley Padilla does not desire to change her sertraline as her mood and sleep have improved.   Plan: Continue sertraline 150 mg daily and decrease quetiapine to 50 mg at HS. Next appointment in 3 months or prn. The plan was discussed with patient who had an opportunity to ask questions and these were all answered. I spend 25 minutes in phone cponsultation with the patient.    Stephanie Acre, MD 07/21/2019, 11:17 AM

## 2019-07-22 ENCOUNTER — Encounter: Payer: Self-pay | Admitting: Pharmacist

## 2019-07-22 ENCOUNTER — Ambulatory Visit: Payer: Medicare Other | Attending: Internal Medicine | Admitting: Pharmacist

## 2019-07-22 ENCOUNTER — Other Ambulatory Visit: Payer: Self-pay

## 2019-07-22 VITALS — BP 113/74 | HR 73

## 2019-07-22 DIAGNOSIS — I10 Essential (primary) hypertension: Secondary | ICD-10-CM

## 2019-07-22 MED ORDER — QUETIAPINE FUMARATE 25 MG PO TABS: 50.0000 mg | ORAL_TABLET | Freq: Every day | ORAL | 0 refills | Status: DC

## 2019-07-22 NOTE — Progress Notes (Signed)
   S:    PCP: Dr. Wynetta Emery  Patient arrives in good spirits. Presents to the clinic for BP check at the request of Dr. Wynetta Emery. Patient was referred and last seen by PCP on 05/29/19. I last saw her on 06/24/19 - we added amlodipine to her regimen. Pt reports doing well; left arm fracture is followed by Ortho. Pt continues to use ibuprofen prn for pain management. She does endorse increased soreness today; reports walking ~2.5 hrs yesterday.  Patient reports adherence with medications.   Denies chest pain, shortness of breath, HA or blurred vision. Denies BLE edema.   Current BP Medications include:  Lisinopril 20 mg daily, amlodipine 5 mg daily (takes both at bedtime); denies any side effects  Antihypertensives tried in the past include: none other then lisinopril, amlodipine   Dietary habits include: follows a DASH diet; drinks sweat tea but limits to when she eats out Exercise habits include: walks frequently  Family / Social history: - FHX:  Negative for HTN - never smoker - denies using alcohol  O:  Home BP readings:  - Brings in range: 122 - 135/76-88  Last 3 Office BP readings: BP Readings from Last 3 Encounters:  07/22/19 113/74  06/24/19 (!) 142/94  06/12/19 137/84   BMET    Component Value Date/Time   NA 145 (H) 06/24/2019 1008   K 4.0 06/24/2019 1008   CL 105 06/24/2019 1008   CO2 23 06/24/2019 1008   GLUCOSE 92 06/24/2019 1008   BUN 13 06/24/2019 1008   CREATININE 0.89 06/24/2019 1008   CALCIUM 9.2 06/24/2019 1008   GFRNONAA 67 06/24/2019 1008   GFRAA 78 06/24/2019 1008   Renal function: CrCl cannot be calculated (Patient's most recent lab result is older than the maximum 21 days allowed.).  Clinical ASCVD: No  The 10-year ASCVD risk score Mikey Bussing DC Jr., et al., 2013) is: 7.8%   Values used to calculate the score:     Age: 68 years     Sex: Female     Is Non-Hispanic African American: No     Diabetic: No     Tobacco smoker: No     Systolic Blood  Pressure: 113 mmHg     Is BP treated: Yes     HDL Cholesterol: 49 mg/dL     Total Cholesterol: 216 mg/dL  A/P: Hypertension longstanding currently at goal on current medications. BP Goal = <130/80 mmHg. Patient is adherent with current medications. Her home pressures are good for the most part and she is tolerating her medications well.  -Continued current antihypertensive medications.   -Updated medlist (correct Seroquel dose and removed old opioid rx) -Counseled on lifestyle modifications for blood pressure control including reduced dietary sodium, increased exercise, adequate sleep  Results reviewed and written information provided. Total time in face-to-face counseling 15 minutes.   F/U Clinic Visit 09/07/19 w/ Dr. Wynetta Emery.    Benard Halsted, PharmD, [place] [phone]

## 2019-07-22 NOTE — Patient Instructions (Signed)

## 2019-08-30 ENCOUNTER — Other Ambulatory Visit: Payer: Self-pay | Admitting: Internal Medicine

## 2019-08-30 DIAGNOSIS — I10 Essential (primary) hypertension: Secondary | ICD-10-CM

## 2019-08-30 MED ORDER — AMLODIPINE BESYLATE 5 MG PO TABS: 5.0000 mg | ORAL_TABLET | Freq: Every day | ORAL | 1 refills | Status: DC

## 2019-09-07 ENCOUNTER — Encounter: Payer: Self-pay | Admitting: Internal Medicine

## 2019-09-07 ENCOUNTER — Other Ambulatory Visit: Payer: Self-pay

## 2019-09-07 ENCOUNTER — Ambulatory Visit: Payer: Medicare Other | Attending: Internal Medicine | Admitting: Internal Medicine

## 2019-09-07 VITALS — BP 130/79 | HR 67 | Temp 98.7°F | Resp 18 | Ht 70.0 in | Wt 187.0 lb

## 2019-09-07 DIAGNOSIS — N950 Postmenopausal bleeding: Secondary | ICD-10-CM

## 2019-09-07 DIAGNOSIS — E663 Overweight: Secondary | ICD-10-CM

## 2019-09-07 DIAGNOSIS — D126 Benign neoplasm of colon, unspecified: Secondary | ICD-10-CM | POA: Insufficient documentation

## 2019-09-07 DIAGNOSIS — I10 Essential (primary) hypertension: Secondary | ICD-10-CM

## 2019-09-07 NOTE — Progress Notes (Signed)
Patient complains of feeling "yeast symptoms" a week ago and used an OTC one application treatment. Patient noticed spotty bleeding for several days after using the applicator. Patient has not been sexually active in "some time" patient denies any bleeding or discomfort today.

## 2019-09-07 NOTE — Progress Notes (Signed)
Patient ID: Ashley Padilla, female    DOB: 1951-04-15  MRN: [id number]  CC: Follow-up   Subjective: Ashley Padilla is a 68 y.o. female who presents for chronic ds mnagement Her concerns today include:  Pt with hx of GAD/MDD followed by psychiatry, HTN  Notice vaginal spotting for 2-3 days around 08/22/2019. Uncomfortable feeling, no itching or discgh. Felt like she was getting yeast infection, inserted a monostat supp. Spotting started before she used the Monistat Uterine polyp removed 2012  Informed pt that I received records from [facility].  I did not ge c-scope.  However I did get pathology report indicating that she had a tubular adenoma polyp removed.  Patient tells me that she has had a colonoscopy since then.  She thinks the last one was in 2017.  She will call and get a copy of the report for me Had 12 inches bowel removed in 2010.  HYPERTENSION Currently taking: see medication list Med Adherence: [x]  Yes    []  No Medication side effects: []  Yes    [x]  No Adherence with salt restriction: [x]  Yes    []  No Home Monitoring?: [x]  Yes    2-3 x a wk and has log Monitoring Frequency: []  Yes    []  No Home BP results range: 133/73, 128/75, 131/83, 118/82, 126/78, 120/73 SOB? []  Yes    [x]  No Chest Pain?: []  Yes    [x]  No Leg swelling?: []  Yes    [x]  No Headaches?: []  Yes    [x]  No Dizziness? []  Yes    [x]  No Comments:  Walks daily for 30 mins-1 hr.  Weakness is sweets  HM: Patient reports having had high-dose flu shot on 08/15/2019 at [facility] on [address].  Patient Active Problem List   Diagnosis Date Noted  . Hyperlipidemia, mixed 06/25/2019  . Vitamin D deficiency 06/25/2019  . Wrist pain, chronic, right 05/29/2019  . Acute pain of right shoulder 05/29/2019  . Essential hypertension 05/29/2019  . History of wrist fracture 05/29/2019  . Major depressive disorder, recurrent episode, in full remission ([place]) 01/15/2019  . GAD (generalized anxiety disorder) 01/15/2019      Current Outpatient Medications on File Prior to Visit  Medication Sig Dispense Refill  . amLODipine (NORVASC) 5 MG tablet Take 1 tablet (5 mg total) by mouth daily. 90 tablet 1  . diclofenac sodium (VOLTAREN) 1 % GEL Apply 2 g topically 4 (four) times daily as needed. 100 g 1  . lisinopril (PRINIVIL,ZESTRIL) 20 MG tablet Take 20 mg by mouth daily.    . Multiple Vitamins-Minerals (ONE-A-DAY VITACRAVES ADULT) CHEW Chew 1 tablet by mouth 2 (two) times a day.    . Omega-3 Fatty Acids (FISH OIL PO) Take 1 capsule by mouth daily with breakfast.    . QUEtiapine (SEROQUEL) 25 MG tablet Take 2 tablets (50 mg total) by mouth at bedtime. 180 tablet 0  . sertraline (ZOLOFT) 100 MG tablet TAKE 1.5 TABLETS BY MOUTH DAILY 135 tablet 0   No current facility-administered medications on file prior to visit.     Allergies  Allergen Reactions  . Penicillin V Potassium Other (See Comments)    Reaction happened as a baby- not recalled Did it involve swelling of the face/tongue/throat, SOB, or low BP? Unk Did it involve sudden or severe rash/hives, skin peeling, or any reaction on the inside of your mouth or nose? Unk Did you need to seek medical attention at a hospital or doctor's office? Unk When did it last  happen?"I was a baby" If all above answers are "NO", may proceed with cephalosporin use.    Marland Kitchen Penicillins Other (See Comments)    Reaction happened as a baby- not recalled Did it involve swelling of the face/tongue/throat, SOB, or low BP? Unk Did it involve sudden or severe rash/hives, skin peeling, or any reaction on the inside of your mouth or nose? Unk Did you need to seek medical attention at a hospital or doctor's office? Unk When did it last happen?"I was a baby" If all above answers are "NO", may proceed with cephalosporin use.  . Latex   . Adhesive [Tape] Rash    Terrible rash developed at incision site    Social History   Socioeconomic History  . Marital status: Widowed     Spouse name: Not on file  . Number of children: 3  . Years of education: BS degree  . Highest education level: Not on file  Occupational History  . Occupation: retired Tour manager  . Financial resource strain: Not very hard  . Food insecurity    Worry: Never true    Inability: Never true  . Transportation needs    Medical: No    Non-medical: No  Tobacco Use  . Smoking status: Never Smoker  . Smokeless tobacco: Never Used  Substance and Sexual Activity  . Alcohol use: Not Currently  . Drug use: Not Currently  . Sexual activity: Not Currently  Lifestyle  . Physical activity    Days per week: 7 days    Minutes per session: 40 min  . Stress: Very much  Relationships  . Social connections    Talks on phone: More than three times a week    Gets together: Once a week    Attends religious service: Never    Active member of club or organization: No    Attends meetings of clubs or organizations: Never    Relationship status: Widowed  . Intimate partner violence    Fear of current or ex partner: No    Emotionally abused: No    Physically abused: No    Forced sexual activity: No  Other Topics Concern  . Not on file  Social History Narrative  . Not on file    Family History  Problem Relation Age of Onset  . Anxiety disorder Mother   . Depression Mother   . Alcohol abuse Father   . Anxiety disorder Sister   . Depression Sister   . Anxiety disorder Maternal Grandmother   . Depression Maternal Grandmother     Past Surgical History:  Procedure Laterality Date  . COLON RESECTION N/A 11/2009   for presumed cancerous polyp that turned out not to be CA  . COLON SURGERY  2010   Colon Resection  . COLON SURGERY  2011   Ostomy reversal   . polypectomy uterous  2012  . WRIST FRACTURE SURGERY Bilateral 2018, 03/2019   LT 2018 and RT 2020  . WRIST SURGERY Left     ROS: Review of Systems Negative except as stated above  PHYSICAL EXAM: BP 130/79 (BP Location:  Left Arm, Patient Position: Sitting, Cuff Size: Normal)   Pulse 67   Temp 98.7 F (37.1 C) (Oral)   Resp 18   Ht 5\' 10"  (1.778 m)   Wt 187 lb (84.8 kg)   SpO2 99%   BMI 26.83 kg/m   Physical Exam  General appearance - alert, well appearing, older Caucasian female and in no distress  Mental status - normal mood, behavior, speech, dress, motor activity, and thought processes Neck - supple, no significant adenopathy Chest - clear to auscultation, no wheezes, rales or rhonchi, symmetric air entry Heart - normal rate, regular rhythm, normal S1, S2, no murmurs, rubs, clicks or gallops Pelvic - normal external genitalia, vulva, vagina,.  She seems to have a small polyp within the cervical loss.  Uterus and adnexa.  Opening to the urethra appears erythematous and slightly enlarged Extremities - peripheral pulses normal, no pedal edema, no clubbing or cyanosis   CMP Latest Ref Rng & Units 06/24/2019  Glucose 65 - 99 mg/dL 92  BUN 8 - 27 mg/dL 13  Creatinine 0.57 - 1.00 mg/dL 0.89  Sodium 134 - 144 mmol/L 145(H)  Potassium 3.5 - 5.2 mmol/L 4.0  Chloride 96 - 106 mmol/L 105  CO2 20 - 29 mmol/L 23  Calcium 8.7 - 10.3 mg/dL 9.2  Total Protein 6.0 - 8.5 g/dL 6.6  Total Bilirubin 0.0 - 1.2 mg/dL 0.6  Alkaline Phos 39 - 117 IU/L 70  AST 0 - 40 IU/L 15  ALT 0 - 32 IU/L 20   Lipid Panel     Component Value Date/Time   CHOL 216 (H) 06/24/2019 1008   TRIG 165 (H) 06/24/2019 1008   HDL 49 06/24/2019 1008   CHOLHDL 4.4 06/24/2019 1008   LDLCALC 134 (H) 06/24/2019 1008    CBC    Component Value Date/Time   WBC 6.0 06/24/2019 1008   RBC 4.50 06/24/2019 1008   HGB 13.2 06/24/2019 1008   HCT 40.2 06/24/2019 1008   PLT 198 06/24/2019 1008   MCV 89 06/24/2019 1008   MCH 29.3 06/24/2019 1008   MCHC 32.8 06/24/2019 1008   RDW 14.6 06/24/2019 1008    ASSESSMENT AND PLAN:  1. Essential hypertension Most home readings are at goal.  She will continue current medications and low-salt diet.   2. Post-menopausal bleeding I recommend referral to gynecology.  If this work-up is negative we will then send her to a urologist. - Ambulatory referral to Gynecology  3. Over weight Encourage her to continue healthy eating habits and regular exercise  4. Adenomatous polyp of colon, unspecified part of colon Patient will get a copy of her last colonoscopy report which she thinks was done back in 2017 for our records.    Patient was given the opportunity to ask questions.  Patient verbalized understanding of the plan and was able to repeat key elements of the plan.   No orders of the defined types were placed in this encounter.    Requested Prescriptions    No prescriptions requested or ordered in this encounter    No follow-ups on file.  Karle Plumber, MD, FACP

## 2019-10-09 ENCOUNTER — Encounter: Payer: Medicare Other | Admitting: Obstetrics and Gynecology

## 2019-10-16 ENCOUNTER — Other Ambulatory Visit: Payer: Self-pay | Admitting: Internal Medicine

## 2019-10-19 ENCOUNTER — Other Ambulatory Visit: Payer: Self-pay

## 2019-10-19 ENCOUNTER — Encounter: Payer: Self-pay | Admitting: Obstetrics and Gynecology

## 2019-10-19 ENCOUNTER — Ambulatory Visit: Payer: Medicare Other | Admitting: Obstetrics and Gynecology

## 2019-10-19 ENCOUNTER — Other Ambulatory Visit (HOSPITAL_COMMUNITY)
Admission: RE | Admit: 2019-10-19 | Discharge: 2019-10-19 | Disposition: A | Discharge status: Home / Self Care | Payer: Medicare Other | Source: Ambulatory Visit | Attending: Obstetrics and Gynecology | Admitting: Obstetrics and Gynecology

## 2019-10-19 VITALS — BP 130/86 | HR 69 | Ht 70.0 in | Wt 190.4 lb

## 2019-10-19 DIAGNOSIS — N950 Postmenopausal bleeding: Secondary | ICD-10-CM | POA: Diagnosis present

## 2019-10-19 DIAGNOSIS — Z1151 Encounter for screening for human papillomavirus (HPV): Secondary | ICD-10-CM | POA: Diagnosis not present

## 2019-10-19 DIAGNOSIS — Z1231 Encounter for screening mammogram for malignant neoplasm of breast: Secondary | ICD-10-CM | POA: Insufficient documentation

## 2019-10-19 NOTE — Patient Instructions (Signed)
Postmenopausal Bleeding  Postmenopausal bleeding is any bleeding that occurs after menopause. Menopause is when a woman's period stops. Any type of bleeding after menopause should be checked by your doctor. Treatment will depend on the cause. Follow these instructions at home:  Pay attention to any changes in your symptoms.  Avoid using tampons and douches as told by your doctor.  Change your pads regularly.  Get regular pelvic exams and Pap tests.  Take iron pills as told by your doctor.  Take over-the-counter and prescription medicines only as told by your doctor.  Keep all follow-up visits as told by your doctor. This is important. Contact a doctor if:  Your bleeding lasts for more than 1 week.  You have pain in your belly (abdomen).  You have bleeding during or after sex.  You have bleeding that happens more often than every 3 weeks. Get help right away if:  You have fever, chills, headache, dizziness, muscle aches, or bleeding.  You have very bad pain with bleeding.  You have clumps of blood (blood clots) coming from your vagina.  You have a lot of bleeding, and: ? You use more than 1 pad an hour. ? This kind of bleeding has never happened before.  You feel like you are going to pass out (faint). Summary  Any type of bleeding after menopause should be checked by your doctor.  Pay attention to any changes in your symptoms.  Keep all follow-up visits as told by your doctor. This information is not intended to replace advice given to you by your health care provider. Make sure you discuss any questions you have with your health care provider. Document Released: 09/04/2008 Document Revised: 02/12/2019 Document Reviewed: 01/01/2017 Elsevier Patient Education  2020 [employer] Inc.  

## 2019-10-19 NOTE — Progress Notes (Signed)
Ms Funchess presents in referral for PMB. Pt reports of noticing some blood on her pad in Sept and again in Oct. First time was related to use of vaginal Monistat. Second episode no associated S/Sx Pt wears pad d/t to bowel control related to colon resection in the past Pt not sexual active H/O uterine polyp with removal in 2012. Last pap several yes ago and normal  Medical problems and meds listed in chart  TSVD x 3   PE AF VSS Lungs clear Heart RRR Abd soft + BS  GU Nl EGBUS vaginal mucosa slightly atrophic, small ureteral prolapse, cervix no lesions, pap smear obtained, uterus small mobile non tender no masses  A/P PMB        Screening mammogram  Will check GYN U/S. Pap smear and UC today. F/U after U/S for possible EMBX

## 2019-10-21 ENCOUNTER — Ambulatory Visit (INDEPENDENT_AMBULATORY_CARE_PROVIDER_SITE_OTHER): Payer: Medicare Other | Admitting: Psychiatry

## 2019-10-21 ENCOUNTER — Other Ambulatory Visit: Payer: Self-pay

## 2019-10-21 DIAGNOSIS — F3342 Major depressive disorder, recurrent, in full remission: Secondary | ICD-10-CM | POA: Diagnosis not present

## 2019-10-21 DIAGNOSIS — F411 Generalized anxiety disorder: Secondary | ICD-10-CM

## 2019-10-21 MED ORDER — SERTRALINE HCL 100 MG PO TABS: ORAL_TABLET | ORAL | 0 refills | Status: DC

## 2019-10-21 NOTE — Progress Notes (Signed)
[facility] MD/PA/NP OP Progress Note  10/21/2019 11:12 AM Ah Trost  MRN:  [id number] Interview was conducted by phone and I verified that I was speaking with the correct person using two identifiers. I discussed the limitations of evaluation and management by telemedicine and  the availability of in person appointments. Patient expressed understanding and agreed to proceed.  Chief Complaint: Daytime fatigue.  HPI: 68yo widowed female withGAD and major depression. Mood normalized after dose of sertraline was increased to 150 mg but Izora Gala reportedmore problems with sleep after Seroquel dose was deceased by 25 mg to 50 mg at HS. We increasedit back to 75 mg at bedtime but now wants to try decreasing it again; this time to 50 mg. She started in individual therapy with Dr. Baldo Daub - they talk on weekly basis. No SI, no psychosis. She has an apartment rented till December this year but still thinks that she will move back to [place] if her mood stabilizes and anxiety goes away. Supportive son and daughter-in-law locally.Bernette does not desire to change her sertraline as her mood and sleep have improved.She does however complain of some daytime fatigue/sleepiness - it could possibly be ralated to sertraline as this is the only medication she takes in AM. We also discussed an option of decreasing the dose of sertraline to 100 mg vs moving it to evening and keeping the dose unchenged.  Visit Diagnosis:    ICD-10-CM   1. Major depressive disorder, recurrent episode, in full remission ([place])  F33.42   2. GAD (generalized anxiety disorder)  F41.1     Past Psychiatric History: Please see intake H&P.  Past Medical History:  Past Medical History:  Diagnosis Date  . Anxiety   . Depression   . Hypertension 2016    Past Surgical History:  Procedure Laterality Date  . COLON RESECTION N/A 11/2009   for presumed cancerous polyp that turned out not to be CA  . COLON SURGERY  2010   Colon Resection  . COLON  SURGERY  2011   Ostomy reversal   . polypectomy uterous  2012  . WRIST FRACTURE SURGERY Bilateral 2018, 03/2019   LT 2018 and RT 2020  . WRIST SURGERY Left     Family Psychiatric History: Reviewed.  Family History:  Family History  Problem Relation Age of Onset  . Anxiety disorder Mother   . Depression Mother   . Alcohol abuse Father   . Anxiety disorder Sister   . Depression Sister   . Anxiety disorder Maternal Grandmother   . Depression Maternal Grandmother     Social History:  Social History   Socioeconomic History  . Marital status: Widowed    Spouse name: Not on file  . Number of children: 3  . Years of education: BS degree  . Highest education level: Not on file  Occupational History  . Occupation: retired Tour manager  . Financial resource strain: Not very hard  . Food insecurity    Worry: Never true    Inability: Never true  . Transportation needs    Medical: No    Non-medical: No  Tobacco Use  . Smoking status: Never Smoker  . Smokeless tobacco: Never Used  Substance and Sexual Activity  . Alcohol use: Not Currently  . Drug use: Not Currently  . Sexual activity: Not Currently  Lifestyle  . Physical activity    Days per week: 7 days    Minutes per session: 40 min  . Stress: Very much  Relationships  . Social connections    Talks on phone: More than three times a week    Gets together: Once a week    Attends religious service: Never    Active member of club or organization: No    Attends meetings of clubs or organizations: Never    Relationship status: Widowed  Other Topics Concern  . Not on file  Social History Narrative  . Not on file    Allergies:  Allergies  Allergen Reactions  . Penicillin V Potassium Other (See Comments)    Reaction happened as a baby- not recalled Did it involve swelling of the face/tongue/throat, SOB, or low BP? Unk Did it involve sudden or severe rash/hives, skin peeling, or any reaction on the inside of  your mouth or nose? Unk Did you need to seek medical attention at a hospital or doctor's office? Unk When did it last happen?"I was a baby" If all above answers are "NO", may proceed with cephalosporin use.    Marland Kitchen Penicillins Other (See Comments)    Reaction happened as a baby- not recalled Did it involve swelling of the face/tongue/throat, SOB, or low BP? Unk Did it involve sudden or severe rash/hives, skin peeling, or any reaction on the inside of your mouth or nose? Unk Did you need to seek medical attention at a hospital or doctor's office? Unk When did it last happen?"I was a baby" If all above answers are "NO", may proceed with cephalosporin use.  . Latex   . Adhesive [Tape] Rash    Terrible rash developed at incision site    Metabolic Disorder Labs: No results found for: HGBA1C, MPG No results found for: PROLACTIN Lab Results  Component Value Date   CHOL 216 (H) 06/24/2019   TRIG 165 (H) 06/24/2019   HDL 49 06/24/2019   CHOLHDL 4.4 06/24/2019   LDLCALC 134 (H) 06/24/2019   No results found for: TSH  Therapeutic Level Labs: No results found for: LITHIUM No results found for: VALPROATE No components found for:  CBMZ  Current Medications: Current Outpatient Medications  Medication Sig Dispense Refill  . amLODipine (NORVASC) 5 MG tablet Take 1 tablet (5 mg total) by mouth daily. 90 tablet 1  . diclofenac sodium (VOLTAREN) 1 % GEL Apply 2 g topically 4 (four) times daily as needed. 100 g 1  . lisinopril (PRINIVIL,ZESTRIL) 20 MG tablet Take 20 mg by mouth daily.    . Multiple Vitamins-Minerals (ONE-A-DAY VITACRAVES ADULT) CHEW Chew 1 tablet by mouth 2 (two) times a day.    . Omega-3 Fatty Acids (FISH OIL PO) Take 1 capsule by mouth daily with breakfast.    . QUEtiapine (SEROQUEL) 25 MG tablet TAKE 2 TABLETS (50 MG TOTAL) BY MOUTH AT BEDTIME. 180 tablet 0  . sertraline (ZOLOFT) 100 MG tablet TAKE 1.5 TABLETS BY MOUTH IN THE EVENING 135 tablet 0   No current  facility-administered medications for this visit.      Psychiatric Specialty Exam: Review of Systems  Constitutional: Positive for malaise/fatigue.  Neurological: Speech change: NA.  Psychiatric/Behavioral: The patient is nervous/anxious.   All other systems reviewed and are negative.   There were no vitals taken for this visit.There is no height or weight on file to calculate BMI.  General Appearance: NA  Eye Contact:  NA  Speech:  Clear and Coherent and Normal Rate  Volume:  Normal  Mood:  Mildly anxious  Affect:  NA  Thought Process:  Goal Directed and Linear  Orientation:  Full (Time, Place, and Person)  Thought Content: Logical   Suicidal Thoughts:  No  Homicidal Thoughts:  No  Memory:  Immediate;   Good Recent;   Good Remote;   Good  Judgement:  Good  Insight:  Good  Psychomotor Activity:  NA  Concentration:  Concentration: Good  Recall:  Good  Fund of Knowledge: Good  Language: Good  Akathisia:  Negative  Handed:  Right  AIMS (if indicated): not done  Assets:  Communication Skills Desire for Improvement Financial Resources/Insurance Housing Physical Health Social Support  ADL's:  Intact  Cognition: WNL  Sleep:  Good   Screenings: GAD-7     Office Visit from 09/07/2019 in [facility] Office Visit from 05/29/2019 in [place] Counselor from 11/21/2018 in [place] Counselor from 11/05/2018 in [facility]  Total GAD-7 Score  4  4  6  13     PHQ2-9     Office Visit from 09/07/2019 in [facility] Most recent reading at 09/07/2019  9:49 AM Office Visit from 05/29/2019 in [facility] Most recent reading at 05/29/2019 10:33 AM Counselor from 11/21/2018 in [place] Most recent reading at 11/21/2018  9:00 AM Counselor from 11/04/2018  in [place] Most recent reading at 11/04/2018 11:51 AM Counselor from 11/05/2018 in [place] Most recent reading at 11/04/2018  9:00 AM  PHQ-2 Total Score  2  2  2  6  4   PHQ-9 Total Score  3  5  8  16  13        Assessment and Plan: 68yo widowed female withGAD and major depression. Mood normalized after dose of sertraline was increased to 150 mg but Izora Gala reportedmore problems with sleep after Seroquel dose was deceased by 25 mg to 50 mg at HS. We increasedit back to 75 mg at bedtime but now wants to try decreasing it again; this time to 50 mg. She started in individual therapy with Dr. Baldo Daub - they talk on weekly basis. No SI, no psychosis. She has an apartment rented till December this year but still thinks that she might move back to [place] if her mood stabilizes and anxiety goes away (which she is close to accomplishing). Supportive son and daughter-in-law locally.Koralie does not desire to change her sertraline as her mood and sleep have improved.She does however complain of some daytime fatigue/sleepiness - it could possibly be ralated to sertraline as this is the only medication she takes in AM. We also discussed an option of decreasing the dose of sertraline to 100 mg vs moving it to evening and keeping the dose unchenged.  Dx: GAD; MDD recurrent in remission  Plan: Continue sertraline 150 mg daily but move it to evening hour, continue quetiapine to 50 mg at HS. Next appointment in 3 months or prn. The plan was discussed with patient who had an opportunity to ask questions and these were all answered. I spend 25 minutes in phone consultation with the patient.     Stephanie Acre, MD 10/21/2019, 11:12 AM

## 2019-10-22 ENCOUNTER — Telehealth (INDEPENDENT_AMBULATORY_CARE_PROVIDER_SITE_OTHER): Payer: Medicare Other | Admitting: Lactation Services

## 2019-10-22 DIAGNOSIS — N950 Postmenopausal bleeding: Secondary | ICD-10-CM

## 2019-10-22 LAB — CYTOLOGY - PAP
Chlamydia: NEGATIVE
Comment: NEGATIVE
Comment: NEGATIVE
Comment: NORMAL
Diagnosis: REACTIVE
High risk HPV: NEGATIVE
Neisseria Gonorrhea: NEGATIVE

## 2019-10-22 LAB — URINE CULTURE

## 2019-10-22 MED ORDER — NITROFURANTOIN MONOHYD MACRO 100 MG PO CAPS: 100.0000 mg | ORAL_CAPSULE | Freq: Two times a day (BID) | ORAL | 0 refills | Status: DC

## 2019-10-22 NOTE — Telephone Encounter (Signed)
Pt left message on the nurse line that wanted to know how to get her Mammogram Scheduled.   Front desk reports pt was given phone # to call and make appt herself. Pt was given [facility].   Pt then asked about her Pelvic US- called Radiology to schedule. Pt was informed her appt is on 11/19 at 2 pm. Pt voiced understanding.

## 2019-10-22 NOTE — Addendum Note (Signed)
Addended by: Donn Pierini on: 10/22/2019 04:07 PM   Modules accepted: Orders

## 2019-10-29 ENCOUNTER — Other Ambulatory Visit: Payer: Self-pay

## 2019-10-29 ENCOUNTER — Ambulatory Visit (HOSPITAL_COMMUNITY)
Admission: RE | Admit: 2019-10-29 | Discharge: 2019-10-29 | Disposition: A | Discharge status: Home / Self Care | Payer: Medicare Other | Source: Ambulatory Visit | Attending: Obstetrics and Gynecology | Admitting: Obstetrics and Gynecology

## 2019-10-29 DIAGNOSIS — N950 Postmenopausal bleeding: Secondary | ICD-10-CM | POA: Diagnosis present

## 2019-11-16 ENCOUNTER — Other Ambulatory Visit: Payer: Self-pay

## 2019-11-16 ENCOUNTER — Telehealth: Payer: Self-pay | Admitting: Obstetrics and Gynecology

## 2019-11-16 ENCOUNTER — Ambulatory Visit: Payer: Medicare Other | Admitting: Obstetrics and Gynecology

## 2019-11-16 NOTE — Telephone Encounter (Signed)
Patient was here and checked in for a 10:55am appointment, she came to me at 11:25am and stated she had to leave and that she had another appointment that she had to get to, I offered to reschedule, she said she will call us when she want to reschedule and she left without being seen today.

## 2019-11-18 ENCOUNTER — Telehealth: Payer: Self-pay | Admitting: Obstetrics and Gynecology

## 2019-11-18 NOTE — Telephone Encounter (Signed)
Patient stated she was not able to stay for her her appointment. She is requesting Dr. Rip Harbour call her instead of her coming into the office. She has questions.

## 2019-12-14 ENCOUNTER — Ambulatory Visit: Payer: Medicare Other | Attending: Internal Medicine | Admitting: Internal Medicine

## 2019-12-14 ENCOUNTER — Other Ambulatory Visit: Payer: Self-pay

## 2019-12-14 ENCOUNTER — Encounter: Payer: Self-pay | Admitting: Internal Medicine

## 2019-12-14 DIAGNOSIS — F3342 Major depressive disorder, recurrent, in full remission: Secondary | ICD-10-CM

## 2019-12-14 DIAGNOSIS — N950 Postmenopausal bleeding: Secondary | ICD-10-CM | POA: Diagnosis not present

## 2019-12-14 DIAGNOSIS — I10 Essential (primary) hypertension: Secondary | ICD-10-CM | POA: Diagnosis not present

## 2019-12-14 NOTE — Progress Notes (Signed)
Virtual Visit via Telephone Note Due to current restrictions/limitations of in-office visits due to the COVID-19 pandemic, this scheduled clinical appointment was converted to a telehealth visit  I connected with Ashley Padilla on 12/14/19 at 12:33 p.m by telephone and verified that I am speaking with the correct person using two identifiers. I am in my office.  The patient is at home.  Only the patient and myself participated in this encounter.  I discussed the limitations, risks, security and privacy concerns of performing an evaluation and management service by telephone and the availability of in person appointments. I also discussed with the patient that there may be a patient responsible charge related to this service. The patient expressed understanding and agreed to proceed.   History of Present Illness: Pt with hx ofGAD/MDD followed by psychiatry, HTN, HL,   Postmenopausal bleeding:  Saw Dr. Rip Harbour and had neg PAP. Pelvic US revealed abnormal thickness of uterine lining. Found to have UTI and was given abx.  Since then she has been taking Azo Cranberry supplement.  Plan was for possible EMB.  Pt decided she would prefer a female GYN so she scheduled appt with Dr. Orvan Seen later this mth. -She has not had any further vaginal bleeding and thinks the bleeding may have occurred because of the UTI. -Has mammogram scheduled for later this week  HTN: was checking BP regularly but discontinue doing so after Norvasc was added and BP was better with this This a.m was 130/84  Was eating out more so thinks salt intake has increased due to this No LE edema, SoB, CP  MDD:  Followed by Dr. Montel Culver. Doing okay on Zoloft and Seroquel.    Outpatient Encounter Medications as of 12/14/2019  Medication Sig  . amLODipine (NORVASC) 5 MG tablet Take 1 tablet (5 mg total) by mouth daily.  . diclofenac sodium (VOLTAREN) 1 % GEL Apply 2 g topically 4 (four) times daily as needed.  Marland Kitchen lisinopril (PRINIVIL,ZESTRIL)  20 MG tablet Take 20 mg by mouth daily.  . Multiple Vitamins-Minerals (ONE-A-DAY VITACRAVES ADULT) CHEW Chew 1 tablet by mouth 2 (two) times a day.  . nitrofurantoin, macrocrystal-monohydrate, (MACROBID) 100 MG capsule Take 1 capsule (100 mg total) by mouth 2 (two) times daily. (Patient not taking: Reported on 12/14/2019)  . Omega-3 Fatty Acids (FISH OIL PO) Take 1 capsule by mouth daily with breakfast.  . QUEtiapine (SEROQUEL) 25 MG tablet TAKE 2 TABLETS (50 MG TOTAL) BY MOUTH AT BEDTIME.  Marland Kitchen sertraline (ZOLOFT) 100 MG tablet TAKE 1.5 TABLETS BY MOUTH IN THE EVENING   No facility-administered encounter medications on file as of 12/14/2019.    Observations/Objective:   Chemistry      Component Value Date/Time   NA 145 (H) 06/24/2019 1008   K 4.0 06/24/2019 1008   CL 105 06/24/2019 1008   CO2 23 06/24/2019 1008   BUN 13 06/24/2019 1008   CREATININE 0.89 06/24/2019 1008      Component Value Date/Time   CALCIUM 9.2 06/24/2019 1008   ALKPHOS 70 06/24/2019 1008   AST 15 06/24/2019 1008   ALT 20 06/24/2019 1008   BILITOT 0.6 06/24/2019 1008     Lab Results  Component Value Date   WBC 6.0 06/24/2019   HGB 13.2 06/24/2019   HCT 40.2 06/24/2019   MCV 89 06/24/2019   PLT 198 06/24/2019   Lab Results  Component Value Date   CHOL 216 (H) 06/24/2019   HDL 49 06/24/2019   LDLCALC 134 (H) 06/24/2019  TRIG 165 (H) 06/24/2019   CHOLHDL 4.4 06/24/2019     Assessment and Plan: 1. Essential hypertension Advised patient that goal is 130/80 or lower.  Her diastolic blood pressure today was a little above that.  Encouraged her to continue checking blood pressure at least once a week bearing in mind the goal.  She will continue current medications lisinopril and Norvasc  2. Post-menopausal bleeding Patient will keep appointment with the gynecologist later this month.  She has changed to a female gynecologist.  She has not had any further vaginal bleeding  3. Major depressive disorder,  recurrent episode, in full remission ([place]) She is plugged into mental health services and is doing well on her current medications   Follow Up Instructions: Follow-up in 4 months   I discussed the assessment and treatment plan with the patient. The patient was provided an opportunity to ask questions and all were answered. The patient agreed with the plan and demonstrated an understanding of the instructions.   The patient was advised to call back or seek an in-person evaluation if the symptoms worsen or if the condition fails to improve as anticipated.  I provided 12 minutes of non-face-to-face time during this encounter.   Karle Plumber, MD

## 2019-12-16 ENCOUNTER — Ambulatory Visit
Admission: RE | Admit: 2019-12-16 | Discharge: 2019-12-16 | Disposition: A | Discharge status: Home / Self Care | Payer: Medicare Other | Source: Ambulatory Visit | Attending: Obstetrics and Gynecology | Admitting: Obstetrics and Gynecology

## 2019-12-16 ENCOUNTER — Other Ambulatory Visit: Payer: Self-pay

## 2019-12-16 DIAGNOSIS — Z1231 Encounter for screening mammogram for malignant neoplasm of breast: Secondary | ICD-10-CM

## 2019-12-24 ENCOUNTER — Other Ambulatory Visit: Payer: Self-pay | Admitting: Obstetrics and Gynecology

## 2019-12-24 DIAGNOSIS — R928 Other abnormal and inconclusive findings on diagnostic imaging of breast: Secondary | ICD-10-CM

## 2020-01-04 ENCOUNTER — Other Ambulatory Visit: Payer: Self-pay

## 2020-01-04 ENCOUNTER — Other Ambulatory Visit: Payer: Self-pay | Admitting: Obstetrics and Gynecology

## 2020-01-04 ENCOUNTER — Ambulatory Visit
Admission: RE | Admit: 2020-01-04 | Discharge: 2020-01-04 | Disposition: A | Discharge status: Home / Self Care | Payer: Medicare Other | Source: Ambulatory Visit | Attending: Obstetrics and Gynecology | Admitting: Obstetrics and Gynecology

## 2020-01-04 ENCOUNTER — Telehealth: Payer: Medicare Other | Admitting: Obstetrics and Gynecology

## 2020-01-04 DIAGNOSIS — R921 Mammographic calcification found on diagnostic imaging of breast: Secondary | ICD-10-CM

## 2020-01-04 DIAGNOSIS — R928 Other abnormal and inconclusive findings on diagnostic imaging of breast: Secondary | ICD-10-CM

## 2020-01-05 ENCOUNTER — Ambulatory Visit: Payer: Medicare Other

## 2020-01-06 ENCOUNTER — Other Ambulatory Visit: Payer: Self-pay

## 2020-01-06 ENCOUNTER — Ambulatory Visit
Admission: RE | Admit: 2020-01-06 | Discharge: 2020-01-06 | Disposition: A | Discharge status: Home / Self Care | Payer: Medicare Other | Source: Ambulatory Visit | Attending: Obstetrics and Gynecology | Admitting: Obstetrics and Gynecology

## 2020-01-06 DIAGNOSIS — R921 Mammographic calcification found on diagnostic imaging of breast: Secondary | ICD-10-CM

## 2020-01-14 ENCOUNTER — Ambulatory Visit: Payer: Medicare Other | Attending: Internal Medicine

## 2020-01-14 DIAGNOSIS — Z23 Encounter for immunization: Secondary | ICD-10-CM

## 2020-01-14 NOTE — Progress Notes (Signed)
   Covid-19 Vaccination Clinic  Name:  Ashley Padilla    MRN: [id number] DOB: May 11, 1951  01/14/2020  [name] was observed post Covid-19 immunization for 15 minutes without incidence. She was provided with Vaccine Information Sheet and instruction to access the V-Safe system.   [name] was instructed to call 911 with any severe reactions post vaccine: Marland Kitchen Difficulty breathing  . Swelling of your face and throat  . A fast heartbeat  . A bad rash all over your body  . Dizziness and weakness    Immunizations Administered    Name Date Dose VIS Date Route   Pfizer COVID-19 Vaccine 01/14/2020  5:19 PM 0.3 mL 11/20/2019 Intramuscular   Manufacturer: [place]   Lot: [id number]   [place]: [id number]

## 2020-01-18 ENCOUNTER — Ambulatory Visit (INDEPENDENT_AMBULATORY_CARE_PROVIDER_SITE_OTHER): Payer: Medicare Other | Admitting: Psychiatry

## 2020-01-18 ENCOUNTER — Other Ambulatory Visit: Payer: Self-pay

## 2020-01-18 DIAGNOSIS — F3342 Major depressive disorder, recurrent, in full remission: Secondary | ICD-10-CM | POA: Diagnosis not present

## 2020-01-18 DIAGNOSIS — F411 Generalized anxiety disorder: Secondary | ICD-10-CM

## 2020-01-18 MED ORDER — QUETIAPINE FUMARATE 50 MG PO TABS: 50.0000 mg | ORAL_TABLET | Freq: Every day | ORAL | 1 refills | Status: DC

## 2020-01-18 NOTE — Progress Notes (Signed)
[facility] MD/PA/NP OP Progress Note  01/18/2020 10:12 AM Ashley Padilla  MRN:  [id number] Interview was conducted by phone and I verified that I was speaking with the correct person using two identifiers. I discussed the limitations of evaluation and management by telemedicine and  the availability of in person appointments. Patient expressed understanding and agreed to proceed.  Chief Complaint: "I feel good".  HPI: 69yo widowed female withGAD and major depression.Moodnormalizedafter dose of sertraline was increased to 150 mg but Ashley Padilla reportedmore problems with sleep after Seroquel dose was deceased by 25 mg to 50 mg at HS. We increasedit back to 75 mg at bedtimebut now wants to try decreasing it again; this time to 50 mg.She started individual therapy.No SI, no psychosis. She has not decided if she wants to stay in [place] or move to [place] where some of her family lives. "I sometimes feel like a burden to my son" - she says. She plans to sell her apartment in [place] however. Supportive son and daughter-in-law locally.Ashley Padilla does not desire to change hersertralineas her mood and sleep have improved. We also discussed an option of decreasing the dose of sertraline to 100 mg vs moving it to evening and keeping the dose unchanged. She no longer feels tired in the day after sertraline was moved to dinner time.  Visit Diagnosis:    ICD-10-CM   1. GAD (generalized anxiety disorder)  F41.1   2. Major depressive disorder, recurrent episode, in full remission ([place])  F33.42     Past Psychiatric History: Please see intake H&P.  Past Medical History:  Past Medical History:  Diagnosis Date  . Anxiety   . Depression   . Hypertension 2016    Past Surgical History:  Procedure Laterality Date  . COLON RESECTION N/A 11/2009   for presumed cancerous polyp that turned out not to be CA  . COLON SURGERY  2010   Colon Resection  . COLON SURGERY  2011   Ostomy reversal   . polypectomy uterous   2012  . WRIST FRACTURE SURGERY Bilateral 2018, 03/2019   LT 2018 and RT 2020  . WRIST SURGERY Left     Family Psychiatric History: Reviewed.  Family History:  Family History  Problem Relation Age of Onset  . Anxiety disorder Mother   . Depression Mother   . Alcohol abuse Father   . Anxiety disorder Sister   . Depression Sister   . Breast cancer Sister   . Anxiety disorder Maternal Grandmother   . Depression Maternal Grandmother     Social History:  Social History   Socioeconomic History  . Marital status: Widowed    Spouse name: Not on file  . Number of children: 3  . Years of education: BS degree  . Highest education level: Not on file  Occupational History  . Occupation: retired Pharmacist, hospital  Tobacco Use  . Smoking status: Never Smoker  . Smokeless tobacco: Never Used  Substance and Sexual Activity  . Alcohol use: Not Currently  . Drug use: Not Currently  . Sexual activity: Not Currently  Other Topics Concern  . Not on file  Social History Narrative  . Not on file   Social Determinants of Health   Financial Resource Strain:   . Difficulty of Paying Living Expenses: Not on file  Food Insecurity:   . Worried About Charity fundraiser in the Last Year: Not on file  . Ran Out of Food in the Last Year: Not on file  Transportation  Needs:   . Lack of Transportation (Medical): Not on file  . Lack of Transportation (Non-Medical): Not on file  Physical Activity:   . Days of Exercise per Week: Not on file  . Minutes of Exercise per Session: Not on file  Stress:   . Feeling of Stress : Not on file  Social Connections:   . Frequency of Communication with Friends and Family: Not on file  . Frequency of Social Gatherings with Friends and Family: Not on file  . Attends Religious Services: Not on file  . Active Member of Clubs or Organizations: Not on file  . Attends Archivist Meetings: Not on file  . Marital Status: Not on file    Allergies:  Allergies   Allergen Reactions  . Penicillin V Potassium Other (See Comments)    Reaction happened as a baby- not recalled Did it involve swelling of the face/tongue/throat, SOB, or low BP? Unk Did it involve sudden or severe rash/hives, skin peeling, or any reaction on the inside of your mouth or nose? Unk Did you need to seek medical attention at a hospital or doctor's office? Unk When did it last happen?"I was a baby" If all above answers are "NO", may proceed with cephalosporin use.    Ashley Padilla Kitchen Penicillins Other (See Comments)    Reaction happened as a baby- not recalled Did it involve swelling of the face/tongue/throat, SOB, or low BP? Unk Did it involve sudden or severe rash/hives, skin peeling, or any reaction on the inside of your mouth or nose? Unk Did you need to seek medical attention at a hospital or doctor's office? Unk When did it last happen?"I was a baby" If all above answers are "NO", may proceed with cephalosporin use.  . Latex   . Adhesive [Tape] Rash    Terrible rash developed at incision site    Metabolic Disorder Labs: No results found for: HGBA1C, MPG No results found for: PROLACTIN Lab Results  Component Value Date   CHOL 216 (H) 06/24/2019   TRIG 165 (H) 06/24/2019   HDL 49 06/24/2019   CHOLHDL 4.4 06/24/2019   LDLCALC 134 (H) 06/24/2019   No results found for: TSH  Therapeutic Level Labs: No results found for: LITHIUM No results found for: VALPROATE No components found for:  CBMZ  Current Medications: Current Outpatient Medications  Medication Sig Dispense Refill  . amLODipine (NORVASC) 5 MG tablet Take 1 tablet (5 mg total) by mouth daily. 90 tablet 1  . diclofenac sodium (VOLTAREN) 1 % GEL Apply 2 g topically 4 (four) times daily as needed. 100 g 1  . lisinopril (PRINIVIL,ZESTRIL) 20 MG tablet Take 20 mg by mouth daily.    . Multiple Vitamins-Minerals (ONE-A-DAY VITACRAVES ADULT) CHEW Chew 1 tablet by mouth 2 (two) times a day.    . Omega-3 Fatty Acids  (FISH OIL PO) Take 1 capsule by mouth daily with breakfast.    . QUEtiapine (SEROQUEL) 50 MG tablet Take 1 tablet (50 mg total) by mouth at bedtime. 90 tablet 1  . sertraline (ZOLOFT) 100 MG tablet TAKE 1.5 TABLETS BY MOUTH IN THE EVENING 135 tablet 0   No current facility-administered medications for this visit.      Psychiatric Specialty Exam: Review of Systems  All other systems reviewed and are negative.   There were no vitals taken for this visit.There is no height or weight on file to calculate BMI.  General Appearance: NA  Eye Contact:  NA  Speech:  Clear and  Coherent and Normal Rate  Volume:  Normal  Mood:  Euthymic  Affect:  NA  Thought Process:  Goal Directed and Linear  Orientation:  Full (Time, Place, and Person)  Thought Content: Logical   Suicidal Thoughts:  No  Homicidal Thoughts:  No  Memory:  Immediate;   Good Recent;   Good Remote;   Good  Judgement:  Good  Insight:  Good  Psychomotor Activity:  NA  Concentration:  Concentration: Good  Recall:  Good  Fund of Knowledge: Good  Language: Good  Akathisia:  Negative  Handed:  Right  AIMS (if indicated): not done  Assets:  Agricultural consultant [place] Talents/Skills  ADL's:  Intact  Cognition: WNL  Sleep:  Good   Screenings: GAD-7     Office Visit from 09/07/2019 in [facility] Office Visit from 05/29/2019 in [facility] Counselor from 11/21/2018 in [facility] Counselor from 11/05/2018 in [facility]  Total GAD-7 Score  4  4  6  13     PHQ2-9     Office Visit from 09/07/2019 in [facility] Most recent reading at 09/07/2019  9:49 AM Office Visit from 05/29/2019 in [facility] Most recent reading at 05/29/2019 10:33 AM Counselor from 11/21/2018 in  [facility] Most recent reading at 11/21/2018  9:00 AM Counselor from 11/04/2018 in [facility] Most recent reading at 11/04/2018 11:51 AM Counselor from 11/05/2018 in [facility] Most recent reading at 11/04/2018  9:00 AM  PHQ-2 Total Score  2  2  2  6  4   PHQ-9 Total Score  3  5  8  16  13        Assessment and Plan: 69yo widowed female withGAD and major depression.Moodnormalizedafter dose of sertraline was increased to 150 mg but Ashley Padilla reportedmore problems with sleep after Seroquel dose was deceased by 25 mg to 50 mg at HS. We increasedit back to 75 mg at bedtimebut now wants to try decreasing it again; this time to 50 mg.She started individual therapy.No SI, no psychosis. She has not decided if she wants to stay in [place] or move to [place] where some of her family lives. "I sometimes feel like a burden to my son" - she says. She plans to sell her apartment in [place] however. Supportive son and daughter-in-law locally.[place] does not desire to change hersertralineas her mood and sleep have improved. We also discussed an option of decreasing the dose of sertraline to 100 mg vs moving it to evening and keeping the dose unchanged. She no longer feels tired in the day after sertraline was moved to dinner time.  Dx: GAD; MDD recurrent in remission  Plan: Continue sertraline 150 mg in PM, continue quetiapine to 50 mg at HS. Next appointment in 4 months or prn.The plan was discussed with patient who had an opportunity to ask questions and these were all answered. I spend25 minutes inphone consultation with the patient.    Stephanie Acre, MD 01/18/2020, 10:12 AM

## 2020-01-26 ENCOUNTER — Ambulatory Visit: Payer: Medicare Other

## 2020-02-09 ENCOUNTER — Ambulatory Visit: Payer: Medicare Other | Attending: Internal Medicine

## 2020-02-09 DIAGNOSIS — Z23 Encounter for immunization: Secondary | ICD-10-CM | POA: Insufficient documentation

## 2020-02-09 NOTE — Progress Notes (Signed)
   Covid-19 Vaccination Clinic  Name:  Ashley Padilla    MRN: [id number] DOB: July 18, 1951  02/09/2020  [name] was observed post Covid-19 immunization for 15 minutes without incident. She was provided with Vaccine Information Sheet and instruction to access the V-Safe system.   [name] was instructed to call 911 with any severe reactions post vaccine: Marland Kitchen Difficulty breathing  . Swelling of face and throat  . A fast heartbeat  . A bad rash all over body  . Dizziness and weakness   Immunizations Administered    Name Date Dose VIS Date Route   Pfizer COVID-19 Vaccine 02/09/2020 10:51 AM 0.3 mL 11/20/2019 Intramuscular   Manufacturer: [place]   Lot: [id number]   Mutual: [id number]

## 2020-02-24 ENCOUNTER — Other Ambulatory Visit: Payer: Self-pay | Admitting: Internal Medicine

## 2020-02-24 DIAGNOSIS — I10 Essential (primary) hypertension: Secondary | ICD-10-CM

## 2020-03-13 ENCOUNTER — Other Ambulatory Visit (HOSPITAL_COMMUNITY): Payer: Self-pay | Admitting: Psychiatry

## 2020-04-13 ENCOUNTER — Encounter (INDEPENDENT_AMBULATORY_CARE_PROVIDER_SITE_OTHER): Payer: Self-pay | Admitting: Ophthalmology

## 2020-04-13 ENCOUNTER — Ambulatory Visit (INDEPENDENT_AMBULATORY_CARE_PROVIDER_SITE_OTHER): Payer: Medicare Other | Admitting: Ophthalmology

## 2020-04-13 ENCOUNTER — Other Ambulatory Visit: Payer: Self-pay

## 2020-04-13 DIAGNOSIS — H2513 Age-related nuclear cataract, bilateral: Secondary | ICD-10-CM | POA: Diagnosis not present

## 2020-04-13 DIAGNOSIS — H33312 Horseshoe tear of retina without detachment, left eye: Secondary | ICD-10-CM

## 2020-04-13 DIAGNOSIS — H4312 Vitreous hemorrhage, left eye: Secondary | ICD-10-CM | POA: Insufficient documentation

## 2020-04-13 NOTE — Progress Notes (Signed)
04/13/2020     CHIEF COMPLAINT Patient presents for Retina Follow Up   HISTORY OF PRESENT ILLNESS: Ashley Padilla is a 69 y.o. female who presents to the clinic today for:   HPI    Retina Follow Up    Patient presents with  Other.  In left eye.  Duration of 2 months.  Since onset it is stable.          Comments    Patient states that in March she had a retinal tear OS which was treated by Dr. Robyne Peers with a laser retinopexy. Patient states things were getting better after the laser. Patient states that she went to see Dr. Katy Fitch and he told her that he saw something that concerned him in her left eye and referred her here. Patient states she has a floater that bothers her in OS.       Last edited by Hurman Horn, MD on 04/13/2020 10:30 AM. (History)      Referring physician: Ladell Pier, MD [place],  [place] [address]  HISTORICAL INFORMATION:   Selected notes from the MEDICAL RECORD NUMBER       CURRENT MEDICATIONS: No current outpatient medications on file. (Ophthalmic Drugs)   No current facility-administered medications for this visit. (Ophthalmic Drugs)   Current Outpatient Medications (Other)  Medication Sig  . amLODipine (NORVASC) 5 MG tablet TAKE 1 TABLET BY MOUTH EVERY DAY  . diclofenac sodium (VOLTAREN) 1 % GEL Apply 2 g topically 4 (four) times daily as needed.  Marland Kitchen lisinopril (PRINIVIL,ZESTRIL) 20 MG tablet Take 20 mg by mouth daily.  . Multiple Vitamins-Minerals (ONE-A-DAY VITACRAVES ADULT) CHEW Chew 1 tablet by mouth 2 (two) times a day.  . Omega-3 Fatty Acids (FISH OIL PO) Take 1 capsule by mouth daily with breakfast.  . QUEtiapine (SEROQUEL) 50 MG tablet Take 1 tablet (50 mg total) by mouth at bedtime.  . sertraline (ZOLOFT) 100 MG tablet TAKE 1.5 TABLETS BY MOUTH IN THE EVENING   No current facility-administered medications for this visit. (Other)      REVIEW OF SYSTEMS:    ALLERGIES Allergies  Allergen Reactions  .  Penicillin V Potassium Other (See Comments)    Reaction happened as a baby- not recalled Did it involve swelling of the face/tongue/throat, SOB, or low BP? Unk Did it involve sudden or severe rash/hives, skin peeling, or any reaction on the inside of your mouth or nose? Unk Did you need to seek medical attention at a hospital or doctor's office? Unk When did it last happen?"I was a baby" If all above answers are "NO", may proceed with cephalosporin use.    Marland Kitchen Penicillins Other (See Comments)    Reaction happened as a baby- not recalled Did it involve swelling of the face/tongue/throat, SOB, or low BP? Unk Did it involve sudden or severe rash/hives, skin peeling, or any reaction on the inside of your mouth or nose? Unk Did you need to seek medical attention at a hospital or doctor's office? Unk When did it last happen?"I was a baby" If all above answers are "NO", may proceed with cephalosporin use.  . Latex   . Adhesive [Tape] Rash    Terrible rash developed at incision site    PAST MEDICAL HISTORY Past Medical History:  Diagnosis Date  . Anxiety   . Depression   . Hypertension 2016   Past Surgical History:  Procedure Laterality Date  . COLON RESECTION N/A 11/2009  for presumed cancerous polyp that turned out not to be CA  . COLON SURGERY  2010   Colon Resection  . COLON SURGERY  2011   Ostomy reversal   . polypectomy uterous  2012  . WRIST FRACTURE SURGERY Bilateral 2018, 03/2019   LT 2018 and RT 2020  . WRIST SURGERY Left     FAMILY HISTORY Family History  Problem Relation Age of Onset  . Anxiety disorder Mother   . Depression Mother   . Alcohol abuse Father   . Anxiety disorder Sister   . Depression Sister   . Breast cancer Sister   . Anxiety disorder Maternal Grandmother   . Depression Maternal Grandmother     SOCIAL HISTORY Social History   Tobacco Use  . Smoking status: Never Smoker  . Smokeless tobacco: Never Used  Substance Use Topics  . Alcohol  use: Not Currently  . Drug use: Not Currently         OPHTHALMIC EXAM:  Base Eye Exam    Visual Acuity (Snellen - Linear)      Right Left   Dist cc 20/25-2 20/25-2   Correction: Glasses       Tonometry (Tonopen, 8:59 AM)      Right Left   Pressure 12 13       Pupils      Pupils Dark Light Shape React APD   Right PERRL 5 3 Round Brisk None   Left PERRL 5 3 Round Brisk None       Visual Fields (Counting fingers)      Left Right    Full Full       Extraocular Movement      Right Left    Full Full       Neuro/Psych    Oriented x3: Yes   Mood/Affect: Normal       Dilation    Both eyes: 1.0% Mydriacyl, 2.5% Phenylephrine @ 8:59 AM        Slit Lamp and Fundus Exam    External Exam      Right Left   External Normal Normal       Slit Lamp Exam      Right Left   Lids/Lashes Normal Normal   Conjunctiva/Sclera White and quiet White and quiet   Cornea Clear Clear   Anterior Chamber Deep and quiet Deep and quiet   Iris Round and reactive Round and reactive   Lens 2+ Nuclear sclerosis 2+ Nuclear sclerosis   Anterior Vitreous Normal Normal       Fundus Exam      Right Left   Posterior Vitreous  Vitreous hemorrhage 2+ old inferior   Disc Normal Normal   C/D Ratio 0.2 0.2   Macula Normal Normal   Vessels Normal Normal   Periphery Normal Retinal break, 330 good retinopexy, no new breaks seen, poor view inferior due to dense old vitreous hemorrhage          IMAGING AND PROCEDURES  Imaging and Procedures for 04/13/20  Color Fundus Photography Optos - OU - Both Eyes       Right Eye Progression has no prior data. Disc findings include normal observations. Macula : normal observations. Vessels : normal observations. Periphery : normal observations.   Left Eye Progression has no prior data. Disc findings include normal observations. Macula : normal observations. Vessels : normal observations. Periphery : normal observations.   Notes OS, with vitreous  hemorrhage, retinal tear not seen on color fundus photography  temporally.       OCT, Retina - OU - Both Eyes       Right Eye Quality was good. Scan locations included subfoveal. Central Foveal Thickness: 314. Progression has no prior data. Findings include normal observations.   Left Eye Quality was good. Scan locations included subfoveal. Central Foveal Thickness: 315. Progression has no prior data. Findings include normal observations.   Notes No active maculopathy in either eye       B-Scan Ultrasound - OS - Left Eye       Findings included posterior vitreous detachment, vitreous opacities, vitreous hemorrhage.   Notes Retinal tear is noted at the 3 3:30 position temporally.  There are no other retinal breaks.  There is no retinal detachment.                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 Cherlyn Labella.  No new breaks seen on clinical examination nor on B-scan ultrasonography in all quadrants.  Vitreous hemorrhage of left eye (HCC) The nature of the vitreous hemorrhage was discussed with the patient as well as the common causes.   Patients with diabetic eye disease may develop retinal neovascularization.  Other eye conditions develop retinal  neovascularization secondary to retinal venous occlusions.   Vitreous hemorrhage may result from spontaneous vitreous detachment or retinal breaks.  Blunt trauma is a common cause as wll.  The need for serial evaluation of the fundus until  clear views are obtained was addressed. An occasional need  to monitor the condition by in office  B-scan ultrasonography in the case of dense vitreous hemorrhage was discussed.  OS, old, no new red blood in the vitreous.  This is clearing.  View of the inferior retina is hampered yet there are no new holes or tears in the retina is a attached the eye examination and B-scan ultrasonography OS       ICD-10-CM   1. 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 Vitreous hemorrhage of left eye (HCC)  H43.12 Color Fundus Photography Optos - OU - Both Eyes    OCT, Retina - OU - Both Eyes    B-Scan Ultrasound - OS - Left Eye  3. Nuclear sclerotic cataract of both eyes  H25.13 OCT, Retina - OU - Both Eyes    1.,  We will observe the left eye following up of the retinal tear, 2.  Patient has been noted to possibly have a extra macular branch retinal vein occlusion in the far temporal periphery.  This is not well seen today with dispersed vitreous hemorrhage.  This will be monitored as well. 3.  Ophthalmic Meds Ordered this visit:  No orders of the defined types were placed in this encounter.      Return in about 6 weeks (around 05/25/2020) for OS, dilate, COLOR FP.  There are no Patient Instructions on file for this visit.   Explained the diagnoses, plan, and follow up with the patient and they expressed understanding.  Patient expressed understanding of the importance of proper follow up care.   Clent Demark Caitlin Ainley M.D. Diseases & Surgery of the Retina and Vitreous Retina & Diabetic [place] 04/13/20     Abbreviations: M myopia (nearsighted); A astigmatism; H hyperopia (farsighted); P presbyopia; Mrx spectacle prescription;  CTL  contact lenses; OD right eye; OS left eye; OU both eyes  XT exotropia; ET esotropia; PEK punctate epithelial keratitis; PEE punctate epithelial erosions; DES dry eye syndrome; MGD meibomian gland dysfunction; ATs artificial tears; PFAT's preservative free artificial tears; [place] nuclear sclerotic cataract; PSC posterior subcapsular cataract; ERM epi-retinal membrane; PVD posterior vitreous detachment; RD retinal detachment; DM diabetes mellitus; DR diabetic retinopathy; NPDR non-proliferative diabetic retinopathy; PDR proliferative diabetic retinopathy; CSME  clinically significant macular edema; DME diabetic macular edema; dbh dot blot hemorrhages; CWS cotton wool spot; POAG primary open angle glaucoma; C/D cup-to-disc ratio; HVF humphrey visual field; GVF goldmann visual field; OCT optical coherence tomography; IOP intraocular pressure; BRVO Branch retinal vein occlusion; CRVO central retinal vein occlusion; CRAO central retinal artery occlusion; BRAO branch retinal artery occlusion; RT retinal tear; SB scleral buckle; PPV pars plana vitrectomy; VH Vitreous hemorrhage; PRP panretinal laser photocoagulation; IVK intravitreal kenalog; VMT vitreomacular traction; MH Macular hole;  NVD neovascularization of the disc; NVE neovascularization elsewhere; AREDS age related eye disease study; ARMD age related macular degeneration; POAG primary open angle glaucoma; EBMD epithelial/anterior basement membrane dystrophy; ACIOL anterior chamber intraocular lens; IOL intraocular lens; PCIOL posterior chamber intraocular lens; Phaco/IOL phacoemulsification with intraocular lens placement; [place] photorefractive keratectomy; LASIK laser assisted in situ keratomileusis; HTN hypertension; DM diabetes mellitus; COPD chronic obstructive pulmonary disease

## 2020-04-13 NOTE — Assessment & Plan Note (Signed)
The nature of the vitreous hemorrhage was discussed with the patient as well as the common causes.   Patients with diabetic eye disease may develop retinal neovascularization.  Other eye conditions develop retinal  neovascularization secondary to retinal venous occlusions.   Vitreous hemorrhage may result from spontaneous vitreous detachment or retinal breaks.  Blunt trauma is a common cause as wll.  The need for serial evaluation of the fundus until  clear views are obtained was addressed. An occasional need  to monitor the condition by in office  B-scan ultrasonography in the case of dense vitreous hemorrhage was discussed.  OS, old, no new red blood in the vitreous.  This is clearing.  View of the inferior retina is hampered yet there are no new holes or tears in the retina is a attached the eye examination and B-scan ultrasonography OS

## 2020-04-13 NOTE — Assessment & Plan Note (Signed)
Status post laser retinal pexy, [place] [facility] with Dr. Cherlyn Labella.  No new breaks seen on clinical examination nor on B-scan ultrasonography in all quadrants.

## 2020-04-14 ENCOUNTER — Encounter: Payer: Self-pay | Admitting: Internal Medicine

## 2020-04-14 ENCOUNTER — Ambulatory Visit: Payer: Medicare Other | Attending: Internal Medicine | Admitting: Internal Medicine

## 2020-04-14 ENCOUNTER — Other Ambulatory Visit: Payer: Self-pay

## 2020-04-14 VITALS — BP 129/78 | HR 62 | Temp 97.7°F | Resp 16 | Wt 194.0 lb

## 2020-04-14 DIAGNOSIS — E663 Overweight: Secondary | ICD-10-CM

## 2020-04-14 DIAGNOSIS — F3342 Major depressive disorder, recurrent, in full remission: Secondary | ICD-10-CM | POA: Diagnosis not present

## 2020-04-14 DIAGNOSIS — H33312 Horseshoe tear of retina without detachment, left eye: Secondary | ICD-10-CM

## 2020-04-14 DIAGNOSIS — I10 Essential (primary) hypertension: Secondary | ICD-10-CM

## 2020-04-14 DIAGNOSIS — Z780 Asymptomatic menopausal state: Secondary | ICD-10-CM | POA: Diagnosis not present

## 2020-04-14 MED ORDER — LISINOPRIL 20 MG PO TABS: 20.0000 mg | ORAL_TABLET | Freq: Every day | ORAL | 1 refills | Status: DC

## 2020-04-14 NOTE — Progress Notes (Signed)
Patient ID: Ashley Padilla, female    DOB: [birth year]  MRN: [id number]  CC: Hypertension   Subjective: Ashley Padilla is a 69 y.o. female who presents for chronic ds management Her concerns today include:  Pt with hx ofGAD/MDD followed by psychiatry, HTN, HL,   Postmenopausal bleeding:  EMB nl by GYN.  Had retinal tear of LT eye due to vitreous hemorrhage:  Happened while in [place].  Saw ophthalmologist there and had laser treatment.  Saw Dr. Zadie Rhine here in f/u yesterday  HYPERTENSION Currently taking: see medication list Med Adherence: [x]  Yes    []  No Medication side effects: []  Yes    [x]  No Adherence with salt restriction: [x]  Yes    []  No Home Monitoring?: [x]  Yes    []  No Monitoring Frequency: 1-2 x a mth Home BP results range: 113/70, 126/81, 127/80, 129/70 SOB? []  Yes    [x]  No Chest Pain?: []  Yes    [x]  No Leg swelling?: []  Yes    [x]  No Headaches?: []  Yes    [x]  No Dizziness? []  Yes    [x]  No Comments:  Walking almost daily for 40 mins.  Eating a balance meals but weakness is desserts  Dep/GAD:  Followed by [facility].  Last seen 01/2020.  Depression not completely gone but "I am functioning and focusing on the positives." \ HM: She is due for DEXA scan.  She is agreeable to having it scheduled. Patient Active Problem List   Diagnosis Date Noted  . Horseshoe tear of retina of left eye without detachment 04/13/2020  . Vitreous hemorrhage of left eye ([place]) 04/13/2020  . Nuclear sclerotic cataract of both eyes 04/13/2020  . Screening mammogram, encounter for 10/19/2019  . Adenomatous polyp of colon 09/07/2019  . Post-menopausal bleeding 09/07/2019  . Hyperlipidemia, mixed 06/25/2019  . Vitamin D deficiency 06/25/2019  . Wrist pain, chronic, right 05/29/2019  . Acute pain of right shoulder 05/29/2019  . Essential hypertension 05/29/2019  . History of wrist fracture 05/29/2019  . Major depressive disorder, recurrent episode, in full remission ([place]) 01/15/2019  . GAD  (generalized anxiety disorder) 01/15/2019     Current Outpatient Medications on File Prior to Visit  Medication Sig Dispense Refill  . amLODipine (NORVASC) 5 MG tablet TAKE 1 TABLET BY MOUTH EVERY DAY 90 tablet 1  . diclofenac sodium (VOLTAREN) 1 % GEL Apply 2 g topically 4 (four) times daily as needed. 100 g 1  . Multiple Vitamins-Minerals (ONE-A-DAY VITACRAVES ADULT) CHEW Chew 1 tablet by mouth 2 (two) times a day.    . Omega-3 Fatty Acids (FISH OIL PO) Take 1 capsule by mouth daily with breakfast.    . QUEtiapine (SEROQUEL) 50 MG tablet Take 1 tablet (50 mg total) by mouth at bedtime. 90 tablet 1  . sertraline (ZOLOFT) 100 MG tablet TAKE 1.5 TABLETS BY MOUTH IN THE EVENING 135 tablet 0   No current facility-administered medications on file prior to visit.    Allergies  Allergen Reactions  . Penicillin V Potassium Other (See Comments)    Reaction happened as a baby- not recalled Did it involve swelling of the face/tongue/throat, SOB, or low BP? Unk Did it involve sudden or severe rash/hives, skin peeling, or any reaction on the inside of your mouth or nose? Unk Did you need to seek medical attention at a hospital or doctor's office? Unk When did it last happen?"I was a baby" If all above answers are "NO", may proceed with cephalosporin use.    Marland Kitchen  Penicillins Other (See Comments)    Reaction happened as a baby- not recalled Did it involve swelling of the face/tongue/throat, SOB, or low BP? Unk Did it involve sudden or severe rash/hives, skin peeling, or any reaction on the inside of your mouth or nose? Unk Did you need to seek medical attention at a hospital or doctor's office? Unk When did it last happen?"I was a baby" If all above answers are "NO", may proceed with cephalosporin use.  . Latex   . Adhesive [Tape] Rash    Terrible rash developed at incision site    Social History   Socioeconomic History  . Marital status: Widowed    Spouse name: Not on file  . Number of  children: 3  . Years of education: BS degree  . Highest education level: Not on file  Occupational History  . Occupation: retired Pharmacist, hospital  Tobacco Use  . Smoking status: Never Smoker  . Smokeless tobacco: Never Used  Substance and Sexual Activity  . Alcohol use: Not Currently  . Drug use: Not Currently  . Sexual activity: Not Currently  Other Topics Concern  . Not on file  Social History Narrative  . Not on file   Social Determinants of Health   Financial Resource Strain:   . Difficulty of Paying Living Expenses:   Food Insecurity:   . Worried About Charity fundraiser in the Last Year:   . Arboriculturist in the Last Year:   Transportation Needs:   . Film/video editor (Medical):   Marland Kitchen Lack of Transportation (Non-Medical):   Physical Activity:   . Days of Exercise per Week:   . Minutes of Exercise per Session:   Stress:   . Feeling of Stress :   Social Connections:   . Frequency of Communication with Friends and Family:   . Frequency of Social Gatherings with Friends and Family:   . Attends Religious Services:   . Active Member of Clubs or Organizations:   . Attends Archivist Meetings:   Marland Kitchen Marital Status:   Intimate Partner Violence:   . Fear of Current or Ex-Partner:   . Emotionally Abused:   Marland Kitchen Physically Abused:   . Sexually Abused:     Family History  Problem Relation Age of Onset  . Anxiety disorder Mother   . Depression Mother   . Alcohol abuse Father   . Anxiety disorder Sister   . Depression Sister   . Breast cancer Sister   . Anxiety disorder Maternal Grandmother   . Depression Maternal Grandmother     Past Surgical History:  Procedure Laterality Date  . COLON RESECTION N/A 11/2009   for presumed cancerous polyp that turned out not to be CA  . COLON SURGERY  2010   Colon Resection  . COLON SURGERY  2011   Ostomy reversal   . polypectomy uterous  2012  . WRIST FRACTURE SURGERY Bilateral 2018, 03/2019   LT 2018 and RT 2020  .  WRIST SURGERY Left     ROS: Review of Systems Negative except as stated above  PHYSICAL EXAM: BP 129/78   Pulse 62   Temp 97.7 F (36.5 C)   Resp 16   Wt 194 lb (88 kg)   SpO2 99%   BMI 27.84 kg/m   Wt Readings from Last 3 Encounters:  04/14/20 194 lb (88 kg)  10/19/19 190 lb 6.4 oz (86.4 kg)  09/07/19 187 lb (84.8 kg)    Physical Exam  General appearance - alert, well appearing, and in no distress Mental status - normal mood, behavior, speech, dress, motor activity, and thought processes Neck - supple, no significant adenopathy Chest - clear to auscultation, no wheezes, rales or rhonchi, symmetric air entry Heart - normal rate, regular rhythm, normal S1, S2, no murmurs, rubs, clicks or gallops Extremities - peripheral pulses normal, no pedal edema, no clubbing or cyanosis   Depression screen [facility] 2/9 04/14/2020 09/07/2019 05/29/2019  Decreased Interest 1 1 1   Down, Depressed, Hopeless 0 1 1  PHQ - 2 Score 1 2 2   Altered sleeping - 0 0  Tired, decreased energy - 0 1  Change in appetite - 0 0  Feeling bad or failure about yourself  - 1 1  Trouble concentrating - 0 0  Moving slowly or fidgety/restless - 0 0  Suicidal thoughts - 0 1  PHQ-9 Score - 3 5   GAD 7 : Generalized Anxiety Score 04/14/2020 09/07/2019 05/29/2019 11/21/2018  Nervous, Anxious, on Edge 1 1 1 1   Control/stop worrying 0 1 1 1   Worry too much - different things 0 1 1 1   Trouble relaxing 0 0 0 1  Restless 0 0 0 0  Easily annoyed or irritable 0 0 0 0  Afraid - awful might happen 1 1 1 2   Total GAD 7 Score 2 4 4 6   Anxiety Difficulty - - - Somewhat difficult                                                                                                                                                                                                                                                                                                                                             CMP Latest Ref Rng &  Units 06/24/2019  Glucose 65 - 99 mg/dL 92  BUN 8 - 27 mg/dL 13  Creatinine  0.57 - 1.00 mg/dL 0.89  Sodium 134 - 144 mmol/L 145(H)  Potassium 3.5 - 5.2 mmol/L 4.0  Chloride 96 - 106 mmol/L 105  CO2 20 - 29 mmol/L 23  Calcium 8.7 - 10.3 mg/dL 9.2  Total Protein 6.0 - 8.5 g/dL 6.6  Total Bilirubin 0.0 - 1.2 mg/dL 0.6  Alkaline Phos 39 - 117 IU/L 70  AST 0 - 40 IU/L 15  ALT 0 - 32 IU/L 20   Lipid Panel     Component Value Date/Time   CHOL 216 (H) 06/24/2019 1008   TRIG 165 (H) 06/24/2019 1008   HDL 49 06/24/2019 1008   CHOLHDL 4.4 06/24/2019 1008   LDLCALC 134 (H) 06/24/2019 1008    CBC    Component Value Date/Time   WBC 6.0 06/24/2019 1008   RBC 4.50 06/24/2019 1008   HGB 13.2 06/24/2019 1008   HCT 40.2 06/24/2019 1008   PLT 198 06/24/2019 1008   MCV 89 06/24/2019 1008   MCH 29.3 06/24/2019 1008   MCHC 32.8 06/24/2019 1008   RDW 14.6 06/24/2019 1008    ASSESSMENT AND PLAN: 1. Essential hypertension At goal.  Continue amlodipine and lisinopril.  Continue low-salt diet - lisinopril (ZESTRIL) 20 MG tablet; Take 1 tablet (20 mg total) by mouth daily.  Dispense: 90 tablet; Refill: 1  2. Over weight Encouraged her to continue healthy eating habits.  There is specific treats that she eats every week that she purchased from the bakery.  Have asked her to cut back on that to once every 2 weeks.  Continue regular exercise.  3. Recurrent major depressive disorder, in full remission ([place]) Plugged into mental health.  Currently doing well on Zoloft and Seroquel  4. Postmenopausal estrogen deficiency - DG Bone Density; Future  5. Retinal tear of left eye Followed by ophthalmology     Patient was given the opportunity to ask questions.  Patient verbalized understanding of the plan and was able to repeat key elements of the plan.   Orders Placed This Encounter  Procedures  . DG Bone Density     Requested Prescriptions   Signed Prescriptions Disp Refills  .  lisinopril (ZESTRIL) 20 MG tablet 90 tablet 1    Sig: Take 1 tablet (20 mg total) by mouth daily.    Return in about 4 weeks (around 05/12/2020) for Medicare Wellness Visit.  Karle Plumber, MD, FACP

## 2020-05-18 ENCOUNTER — Other Ambulatory Visit: Payer: Self-pay

## 2020-05-18 ENCOUNTER — Telehealth (INDEPENDENT_AMBULATORY_CARE_PROVIDER_SITE_OTHER): Payer: Medicare Other | Admitting: Psychiatry

## 2020-05-18 ENCOUNTER — Encounter (INDEPENDENT_AMBULATORY_CARE_PROVIDER_SITE_OTHER): Payer: Medicare Other | Admitting: Ophthalmology

## 2020-05-18 DIAGNOSIS — F3342 Major depressive disorder, recurrent, in full remission: Secondary | ICD-10-CM

## 2020-05-18 DIAGNOSIS — F411 Generalized anxiety disorder: Secondary | ICD-10-CM | POA: Diagnosis not present

## 2020-05-18 MED ORDER — QUETIAPINE FUMARATE 50 MG PO TABS: 50.0000 mg | ORAL_TABLET | Freq: Every day | ORAL | 1 refills | Status: DC

## 2020-05-18 MED ORDER — SERTRALINE HCL 100 MG PO TABS: 150.0000 mg | ORAL_TABLET | Freq: Every day | ORAL | 0 refills | Status: DC

## 2020-05-18 NOTE — Progress Notes (Addendum)
[place] MD/PA/NP OP Progress Note  05/18/2020 10:12 AM Ashley Padilla  MRN:  [id number] Interview was conducted by phone and I verified that I was speaking with the correct person using two identifiers. I discussed the limitations of evaluation and management by telemedicine and  the availability of in person appointments. Patient expressed understanding and agreed to proceed. Patient location - home; physician - home office.  Chief Complaint: "I am doing well".  HPI: 69yo widowed female withGAD and major depression.Moodnormalizedafter dose of sertraline was increased to 150 mg but Ashley Padilla reportedmore problems with sleep after Seroquel dose was deceased by 25 mg to 50 mg at HS. She reports sleeping 7-8 hours uninterrupted with it. She started individual therapy and she finds it helpful.No SI, no psychosis. She has not decided if she wants to stay in [place] or move to [place] where some of her family lives. She plans to sell her apartment in [place] and will go there to finalize this next week. Supportive son and daughter-in-law locally.Ashley Padilla does not desire to change hersertralineas her mood and sleep have improved.   Visit Diagnosis:    ICD-10-CM   1. Major depressive disorder, recurrent episode, in full remission ([place])  F33.42   2. GAD (generalized anxiety disorder)  F41.1     Past Psychiatric History: Please see intake H&P.  Past Medical History:  Past Medical History:  Diagnosis Date  . Anxiety   . Depression   . Hypertension 2016    Past Surgical History:  Procedure Laterality Date  . COLON RESECTION N/A 11/2009   for presumed cancerous polyp that turned out not to be CA  . COLON SURGERY  2010   Colon Resection  . COLON SURGERY  2011   Ostomy reversal   . polypectomy uterous  2012  . WRIST FRACTURE SURGERY Bilateral 2018, 03/2019   LT 2018 and RT 2020  . WRIST SURGERY Left     Family Psychiatric History: Reviewed.  Family History:  Family History  Problem  Relation Age of Onset  . Anxiety disorder Mother   . Depression Mother   . Alcohol abuse Father   . Anxiety disorder Sister   . Depression Sister   . Breast cancer Sister   . Anxiety disorder Maternal Grandmother   . Depression Maternal Grandmother     Social History:  Social History   Socioeconomic History  . Marital status: Widowed    Spouse name: Not on file  . Number of children: 3  . Years of education: BS degree  . Highest education level: Not on file  Occupational History  . Occupation: retired Pharmacist, hospital  Tobacco Use  . Smoking status: Never Smoker  . Smokeless tobacco: Never Used  Substance and Sexual Activity  . Alcohol use: Not Currently  . Drug use: Not Currently  . Sexual activity: Not Currently  Other Topics Concern  . Not on file  Social History Narrative  . Not on file   Social Determinants of Health   Financial Resource Strain:   . Difficulty of Paying Living Expenses:   Food Insecurity:   . Worried About Charity fundraiser in the Last Year:   . Arboriculturist in the Last Year:   Transportation Needs:   . Film/video editor (Medical):   Marland Kitchen Lack of Transportation (Non-Medical):   Physical Activity:   . Days of Exercise per Week:   . Minutes of Exercise per Session:   Stress:   . Feeling of Stress :  Social Connections:   . Frequency of Communication with Friends and Family:   . Frequency of Social Gatherings with Friends and Family:   . Attends Religious Services:   . Active Member of Clubs or Organizations:   . Attends Archivist Meetings:   Marland Kitchen Marital Status:     Allergies:  Allergies  Allergen Reactions  . Penicillin V Potassium Other (See Comments)    Reaction happened as a baby- not recalled Did it involve swelling of the face/tongue/throat, SOB, or low BP? Unk Did it involve sudden or severe rash/hives, skin peeling, or any reaction on the inside of your mouth or nose? Unk Did you need to seek medical attention at a  hospital or doctor's office? Unk When did it last happen?"I was a baby" If all above answers are "NO", may proceed with cephalosporin use.    Marland Kitchen Penicillins Other (See Comments)    Reaction happened as a baby- not recalled Did it involve swelling of the face/tongue/throat, SOB, or low BP? Unk Did it involve sudden or severe rash/hives, skin peeling, or any reaction on the inside of your mouth or nose? Unk Did you need to seek medical attention at a hospital or doctor's office? Unk When did it last happen?"I was a baby" If all above answers are "NO", may proceed with cephalosporin use.  . Latex   . Adhesive [Tape] Rash    Terrible rash developed at incision site    Metabolic Disorder Labs: No results found for: HGBA1C, MPG No results found for: PROLACTIN Lab Results  Component Value Date   CHOL 216 (H) 06/24/2019   TRIG 165 (H) 06/24/2019   HDL 49 06/24/2019   CHOLHDL 4.4 06/24/2019   LDLCALC 134 (H) 06/24/2019   No results found for: TSH  Therapeutic Level Labs: No results found for: LITHIUM No results found for: VALPROATE No components found for:  CBMZ  Current Medications: Current Outpatient Medications  Medication Sig Dispense Refill  . amLODipine (NORVASC) 5 MG tablet TAKE 1 TABLET BY MOUTH EVERY DAY 90 tablet 1  . diclofenac sodium (VOLTAREN) 1 % GEL Apply 2 g topically 4 (four) times daily as needed. 100 g 1  . lisinopril (ZESTRIL) 20 MG tablet Take 1 tablet (20 mg total) by mouth daily. 90 tablet 1  . Multiple Vitamins-Minerals (ONE-A-DAY VITACRAVES ADULT) CHEW Chew 1 tablet by mouth 2 (two) times a day.    . Omega-3 Fatty Acids (FISH OIL PO) Take 1 capsule by mouth daily with breakfast.    . [START ON 07/16/2020] QUEtiapine (SEROQUEL) 50 MG tablet Take 1 tablet (50 mg total) by mouth at bedtime. 90 tablet 1  . [START ON 06/13/2020] sertraline (ZOLOFT) 100 MG tablet Take 1.5 tablets (150 mg total) by mouth at bedtime. 135 tablet 0   No current facility-administered  medications for this visit.      Psychiatric Specialty Exam: Review of Systems  All other systems reviewed and are negative.   There were no vitals taken for this visit.There is no height or weight on file to calculate BMI.  General Appearance: NA  Eye Contact:  NA  Speech:  Clear and Coherent and Normal Rate  Volume:  Normal  Mood:  Euthymic  Affect:  NA  Thought Process:  Goal Directed and Linear  Orientation:  Full (Time, Place, and Person)  Thought Content: Logical   Suicidal Thoughts:  No  Homicidal Thoughts:  No  Memory:  Immediate;   Good Recent;   Good Remote;  Good  Judgement:  Good  Insight:  Good  Psychomotor Activity:  NA  Concentration:  Concentration: Good  Recall:  Good  Fund of Knowledge: Good  Language: Good  Akathisia:  Negative  Handed:  Right  AIMS (if indicated): not done  Assets:  Communication Skills Desire for Improvement Financial Resources/Insurance Housing Social Support  ADL's:  Intact  Cognition: WNL  Sleep:  Good   Screenings: GAD-7     Office Visit from 04/14/2020 in [facility] Office Visit from 09/07/2019 in [facility] Office Visit from 05/29/2019 in [facility] Counselor from 11/21/2018 in [facility] Counselor from 11/05/2018 in [facility]  Total GAD-7 Score  2  4  4  6  13     PHQ2-9     Office Visit from 04/14/2020 in [facility] Office Visit from 09/07/2019 in [facility] Office Visit from 05/29/2019 in [facility] Counselor from 11/21/2018 in [facility] Counselor from 11/05/2018 in [facility]  PHQ-2 Total Score  1  2  2  2  4   PHQ-9 Total Score  --  3  5  8  13        Assessment and Plan: 69yo widowed  female withGAD and major depression.Moodnormalizedafter dose of sertraline was increased to 150 mg but Ashley Padilla reportedmore problems with sleep after Seroquel dose was deceased by 25 mg to 50 mg at HS.She reports sleeping 7-8 hours uninterrupted with it. She started individual therapy and she finds it helpful.No SI, no psychosis. She has not decided if she wants to stay in [place] or move to [place] where some of her family lives. She plans to sell her apartment in [place] and will go there to finalize this next week. Supportive son and daughter-in-law locally.Ashley Padilla does not desire to change hersertralineas her mood and sleep have improved.   Dx: GAD; MDD recurrent in remission  Plan: Continue sertraline 150 mg in PM, continuequetiapine to 50 mg at HS. Next appointment in 4 months or prn.The plan was discussed with patient who had an opportunity to ask questions and these were all answered. I spend15 minutes inphone consultation with the patient.    Stephanie Acre, MD 05/18/2020, 10:12 AM

## 2020-05-19 ENCOUNTER — Other Ambulatory Visit: Payer: Self-pay

## 2020-05-19 ENCOUNTER — Ambulatory Visit (INDEPENDENT_AMBULATORY_CARE_PROVIDER_SITE_OTHER): Payer: Medicare Other | Admitting: Ophthalmology

## 2020-05-19 ENCOUNTER — Encounter (INDEPENDENT_AMBULATORY_CARE_PROVIDER_SITE_OTHER): Payer: Self-pay | Admitting: Ophthalmology

## 2020-05-19 DIAGNOSIS — H4312 Vitreous hemorrhage, left eye: Secondary | ICD-10-CM

## 2020-05-19 DIAGNOSIS — H33312 Horseshoe tear of retina without detachment, left eye: Secondary | ICD-10-CM | POA: Diagnosis not present

## 2020-05-19 NOTE — Progress Notes (Signed)
05/19/2020     CHIEF COMPLAINT Patient presents for Retina Follow Up   HISTORY OF PRESENT ILLNESS: Ashley Padilla is a 69 y.o. female who presents to the clinic today for:   HPI    Retina Follow Up    Patient presents with  Other.  In left eye.  Duration of 5 weeks.  Since onset it is stable.          Comments    5 week follow up- FP OU Patient denies change in vision and overall has no complaints, except for her left eye is swollen.        Last edited by Gerda Diss on 05/19/2020  9:37 AM. (History)      Referring physician: Ladell Pier, MD [place],  [place] [id number]  HISTORICAL INFORMATION:   Selected notes from the MEDICAL RECORD NUMBER       CURRENT MEDICATIONS: No current outpatient medications on file. (Ophthalmic Drugs)   No current facility-administered medications for this visit. (Ophthalmic Drugs)   Current Outpatient Medications (Other)  Medication Sig  . amLODipine (NORVASC) 5 MG tablet TAKE 1 TABLET BY MOUTH EVERY DAY  . diclofenac sodium (VOLTAREN) 1 % GEL Apply 2 g topically 4 (four) times daily as needed.  Marland Kitchen lisinopril (ZESTRIL) 20 MG tablet Take 1 tablet (20 mg total) by mouth daily.  . Multiple Vitamins-Minerals (ONE-A-DAY VITACRAVES ADULT) CHEW Chew 1 tablet by mouth 2 (two) times a day.  . Omega-3 Fatty Acids (FISH OIL PO) Take 1 capsule by mouth daily with breakfast.  . [START ON 07/16/2020] QUEtiapine (SEROQUEL) 50 MG tablet Take 1 tablet (50 mg total) by mouth at bedtime.  Derrill Memo ON 06/13/2020] sertraline (ZOLOFT) 100 MG tablet Take 1.5 tablets (150 mg total) by mouth at bedtime.   No current facility-administered medications for this visit. (Other)      REVIEW OF SYSTEMS:    ALLERGIES Allergies  Allergen Reactions  . Penicillin V Potassium Other (See Comments)    Reaction happened as a baby- not recalled Did it involve swelling of the face/tongue/throat, SOB, or low BP? Unk Did it involve sudden or  severe rash/hives, skin peeling, or any reaction on the inside of your mouth or nose? Unk Did you need to seek medical attention at a hospital or doctor's office? Unk When did it last happen?"I was a baby" If all above answers are "NO", may proceed with cephalosporin use.    Marland Kitchen Penicillins Other (See Comments)    Reaction happened as a baby- not recalled Did it involve swelling of the face/tongue/throat, SOB, or low BP? Unk Did it involve sudden or severe rash/hives, skin peeling, or any reaction on the inside of your mouth or nose? Unk Did you need to seek medical attention at a hospital or doctor's office? Unk When did it last happen?"I was a baby" If all above answers are "NO", may proceed with cephalosporin use.  . Latex   . Adhesive [Tape] Rash    Terrible rash developed at incision site    PAST MEDICAL HISTORY Past Medical History:  Diagnosis Date  . Anxiety   . Depression   . Hypertension 2016   Past Surgical History:  Procedure Laterality Date  . COLON RESECTION N/A 11/2009   for presumed cancerous polyp that turned out not to be CA  . COLON SURGERY  2010   Colon Resection  . COLON SURGERY  2011   Ostomy reversal   . polypectomy  uterous  2012  . WRIST FRACTURE SURGERY Bilateral 2018, 03/2019   LT 2018 and RT 2020  . WRIST SURGERY Left     FAMILY HISTORY Family History  Problem Relation Age of Onset  . Anxiety disorder Mother   . Depression Mother   . Alcohol abuse Father   . Anxiety disorder Sister   . Depression Sister   . Breast cancer Sister   . Anxiety disorder Maternal Grandmother   . Depression Maternal Grandmother     SOCIAL HISTORY Social History   Tobacco Use  . Smoking status: Never Smoker  . Smokeless tobacco: Never Used  Vaping Use  . Vaping Use: Never used  Substance Use Topics  . Alcohol use: Not Currently  . Drug use: Not Currently         OPHTHALMIC EXAM:  Base Eye Exam    Visual Acuity (Snellen - Linear)      Right Left     Dist cc 20/25+2 20/25       Tonometry (Tonopen, 9:42 AM)      Right Left   Pressure 12 11       Pupils      Pupils Dark Light Shape React APD   Right PERRL 6 3 Round Brisk None   Left PERRL 6 3 Round Brisk None       Visual Fields (Counting fingers)      Left Right    Full Full       Extraocular Movement      Right Left    Full Full       Neuro/Psych    Oriented x3: Yes   Mood/Affect: Normal       Dilation    Left eye: 1.0% Mydriacyl, 2.5% Phenylephrine @ 9:42 AM        Slit Lamp and Fundus Exam    External Exam      Right Left   External Normal Normal       Slit Lamp Exam      Right Left   Lids/Lashes Normal Normal   Conjunctiva/Sclera White and quiet White and quiet   Cornea Clear Clear   Anterior Chamber Deep and quiet Deep and quiet   Iris Round and reactive Round and reactive   Lens 2+ Nuclear sclerosis 2+ Nuclear sclerosis   Anterior Vitreous Normal Normal       Fundus Exam      Right Left   Posterior Vitreous  Vitreous hemorrhage 1+ old inferior   Disc  Normal   C/D Ratio  0.2   Macula  Normal   Vessels  Normal   Periphery  Retinal break, 330,,, good retinopexy, no new breaks seen, poor view inferior due to dense old vitreous hemorrhage,  28D and 20 D lenses near to the oral          IMAGING AND PROCEDURES  Imaging and Procedures for 05/19/20  Color Fundus Photography Optos - OU - Both Eyes       Right Eye Progression has been stable. Disc findings include normal observations. Macula : normal observations. Vessels : normal observations.   Left Eye Progression has been stable. Disc findings include normal observations. Macula : normal observations. Vessels : normal observations.   Notes OD, central vitreous floaters, looks like PVD.  OS clearing vitreous floaters, vitreous hemorrhage from retinal tear no new findings.                ASSESSMENT/PLAN:  Horseshoe tear of retina of  left eye without detachment No new  retinal breaks in follow-up after laser retinopexy for tear OS.  3:30 position laser retinopexy performed elsewhere, [facility] Dr. Cherlyn Labella      ICD-10-CM   1. 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 Vitreous hemorrhage of left eye (HCC)  H43.12 Color Fundus Photography Optos - OU - Both Eyes    1.  2.  3.  Ophthalmic Meds Ordered this visit:  No orders of the defined types were placed in this encounter.      Return in about 6 months (around 11/18/2020) for DILATE OU, OCT.  There are no Patient Instructions on file for this visit.   Explained the diagnoses, plan, and follow up with the patient and they expressed understanding.  Patient expressed understanding of the importance of proper follow up care.   Clent Demark [employer] M.D. Diseases & Surgery of the Retina and Vitreous Retina & Diabetic [place] 05/19/20     Abbreviations: M myopia (nearsighted); A astigmatism; H hyperopia (farsighted); P presbyopia; Mrx spectacle prescription;  CTL contact lenses; OD right eye; OS left eye; OU both eyes  XT exotropia; ET esotropia; PEK punctate epithelial keratitis; PEE punctate epithelial erosions; DES dry eye syndrome; MGD meibomian gland dysfunction; ATs artificial tears; PFAT's preservative free artificial tears; [place] nuclear sclerotic cataract; PSC posterior subcapsular cataract; ERM epi-retinal membrane; PVD posterior vitreous detachment; RD retinal detachment; DM diabetes mellitus; DR diabetic retinopathy; NPDR non-proliferative diabetic retinopathy; PDR proliferative diabetic retinopathy; CSME clinically significant macular edema; DME diabetic macular edema; dbh dot blot hemorrhages; CWS cotton wool spot; POAG primary open angle glaucoma; C/D cup-to-disc ratio; HVF humphrey visual field; GVF goldmann visual field; OCT optical coherence tomography; IOP intraocular pressure; BRVO Branch retinal vein  occlusion; CRVO central retinal vein occlusion; CRAO central retinal artery occlusion; BRAO branch retinal artery occlusion; RT retinal tear; SB scleral buckle; PPV pars plana vitrectomy; VH Vitreous hemorrhage; PRP panretinal laser photocoagulation; IVK intravitreal kenalog; VMT vitreomacular traction; MH Macular hole;  NVD neovascularization of the disc; NVE neovascularization elsewhere; AREDS age related eye disease study; ARMD age related macular degeneration; POAG primary open angle glaucoma; EBMD epithelial/anterior basement membrane dystrophy; ACIOL anterior chamber intraocular lens; IOL intraocular lens; PCIOL posterior chamber intraocular lens; Phaco/IOL phacoemulsification with intraocular lens placement; [place] photorefractive keratectomy; LASIK laser assisted in situ keratomileusis; HTN hypertension; DM diabetes mellitus; COPD chronic obstructive pulmonary disease

## 2020-05-19 NOTE — Assessment & Plan Note (Signed)
No new retinal breaks in follow-up after laser retinopexy for tear OS.  3:30 position laser retinopexy performed elsewhere, [facility] Dr. Cherlyn Labella

## 2020-06-03 ENCOUNTER — Encounter: Payer: Self-pay | Admitting: Internal Medicine

## 2020-06-03 ENCOUNTER — Ambulatory Visit: Payer: Medicare Other | Attending: Internal Medicine | Admitting: Internal Medicine

## 2020-06-03 ENCOUNTER — Other Ambulatory Visit: Payer: Self-pay

## 2020-06-03 VITALS — BP 116/77 | HR 75 | Temp 96.1°F | Resp 16 | Ht 69.0 in | Wt 193.4 lb

## 2020-06-03 DIAGNOSIS — Z1159 Encounter for screening for other viral diseases: Secondary | ICD-10-CM | POA: Diagnosis not present

## 2020-06-03 DIAGNOSIS — R159 Full incontinence of feces: Secondary | ICD-10-CM | POA: Diagnosis not present

## 2020-06-03 DIAGNOSIS — Z0000 Encounter for general adult medical examination without abnormal findings: Secondary | ICD-10-CM

## 2020-06-03 NOTE — Progress Notes (Signed)
Subjective:   Ashley Padilla is a 69 y.o. female who presents for an Initial Medicare Annual Wellness Visit. 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  Advanced Directives 06/03/2020  Does Patient Have a Medical Advance Directive? Yes  Type of Paramedic of [place];Living will  Copy of [place] in Chart? No - copy requested  Some encounter information is confidential and restricted. Go to Review Flowsheets activity to see all data.  Daughter is HCPA I requested that if she feels comfortable doing so, she bring [place] a copy of her living will and document of healthcare power of attorney so that we can scan it into the medical records.  Current Medications (verified) Outpatient Encounter Medications as of 06/03/2020  Medication Sig  . amLODipine (NORVASC) 5 MG tablet TAKE 1 TABLET BY MOUTH EVERY DAY  . diclofenac sodium (VOLTAREN) 1 % GEL Apply 2 g topically 4 (four) times daily as needed.  Marland Kitchen lisinopril (ZESTRIL) 20 MG tablet Take 1 tablet (20 mg total) by mouth daily.  . Multiple Vitamins-Minerals (ONE-A-DAY VITACRAVES ADULT) CHEW Chew 1 tablet by mouth 2 (two) times a day.  . Omega-3 Fatty Acids (FISH OIL PO) Take 1 capsule by mouth daily with breakfast.  . [START ON 07/16/2020] QUEtiapine (SEROQUEL) 50 MG tablet Take 1 tablet (50 mg total) by mouth at bedtime.  Derrill Memo ON 06/13/2020] sertraline (ZOLOFT) 100 MG tablet Take 1.5 tablets (150 mg total) by mouth at bedtime.   No facility-administered encounter medications on file as of 06/03/2020.    Allergies (verified) Penicillin v potassium, Penicillins, Latex, and Adhesive [tape]   History: Past Medical  History:  Diagnosis Date  . Anxiety   . Depression   . Hypertension 2016   Past Surgical History:  Procedure Laterality Date  . COLON RESECTION N/A 11/2009   for presumed cancerous polyp that turned out not to be CA  . COLON SURGERY  2010   Colon Resection  . COLON SURGERY  2011   Ostomy reversal   . polypectomy uterous  2012  . WRIST FRACTURE SURGERY Bilateral 2018, 03/2019   LT 2018 and RT 2020  . WRIST SURGERY Left    Family History  Problem Relation Age of Onset  . Anxiety disorder Mother   . Depression Mother   . Alcohol abuse Father   . Anxiety disorder Sister   . Depression Sister   . Breast cancer Sister   . Anxiety disorder Maternal Grandmother   . Depression Maternal Grandmother    Social History   Socioeconomic History  . Marital status: Widowed    Spouse name: Not on file  . Number of children: 3  . Years of education: BS degree  . Highest education level: Not on file  Occupational History  . Occupation: retired Pharmacist, hospital  Tobacco Use  . Smoking status: Never Smoker  . Smokeless tobacco: Never Used  Vaping Use  . Vaping Use: Never used  Substance and Sexual Activity  . Alcohol use: Not Currently  . Drug use: Not Currently  . Sexual activity: Not Currently  Other Topics Concern  . Not on file  Social History Narrative  . Not on file   Social Determinants of Health   Financial Resource Strain:   . Difficulty of Paying Living Expenses:   Food Insecurity:   . Worried About Charity fundraiser in the Last Year:   . Arboriculturist in the Last Year:   Transportation Needs:   . Film/video editor (Medical):   Marland Kitchen Lack of Transportation (Non-Medical):   Physical Activity:   . Days of Exercise per Week:   . Minutes of Exercise per Session:   Stress:   . Feeling of Stress :   Social Connections:   . Frequency of Communication with Friends and Family:   . Frequency of Social Gatherings with Friends and Family:   . Attends Religious Services:     . Active Member of Clubs or Organizations:   . Attends Archivist Meetings:   Marland Kitchen Marital Status:     Tobacco Counseling nonsmoker  Clinical Intake: Pain : No/denies pain Diabetes: No Diabetic? No  Activities of Daily Living In your present state of health, do you have any difficulty performing the following activities: 06/03/2020  Hearing? N  Vision? N  Difficulty concentrating or making decisions? Y  Walking or climbing stairs? N  Dressing or bathing? N  Doing errands, shopping? N  Preparing Food and eating ? N  Using the Toilet? N  In the past six months, have you accidently leaked urine? Y  Do you have problems with loss of bowel control? Y  Managing your Medications? N  Managing your Finances? N  Housekeeping or managing your Housekeeping? N  Some recent data might be hidden  No problems concentrating but trying to make decision about her living situation.she used to live in [place] and still has a house there.  She moved to [facility] to be close to her son after her husband died.  She has an apartment here in [place].  She will be selling the house in [place].  She has a good relationship with her son but does not want son and his family to feel imposed upon.  We discussed options like assisted living or purchasing a small townhouse that is a 1 level. Urinary incontinent not a major issue but she does get leakage of fecal content when walking. Since bowel surgery.  Saw an incontinence doctor in the past.  Told she could get an electric stimulator to open and close rectum to help decrease the fecal incontinence but she declined.  She is not interested in revisiting that option.  Currently wears pads and is okay with that.  Patient Care Team: Ladell Pier, MD as PCP - General (Internal Medicine) Dr. Arlina Robes - GYN Dr. Deloria Lair - Ophthalmology Dr. Katy Fitch -ophthalmology Dr. Montel Culver - psychaitrist Dr. Mariel Sleet - Dental  Indicate any recent Medical  Services you may have received from other than [facility] providers in the past year (date may be approximate). -I have reviewed the EMR.  Recently seen by Dr. Zadie Rhine.  She sees her psychiatrist Dr. Montel Culver regularly    Assessment:   This is a routine wellness examination for [place].  Hearing/Vision screen Whisper test was normal.  Hearing Screening   125Hz  250Hz  500Hz  1000Hz  2000Hz  3000Hz  4000Hz  6000Hz  8000Hz   Right ear:           Left ear:             Visual Acuity Screening  Right eye Left eye Both eyes  Without correction:     With correction: 20 20 20 20 20 20     Dietary issues and exercise activities discussed: "I have a sweet tooth but I am trying."  She likes to visit a particular bakery to get her sweet treats.  On last visit we had asked her to cut back on that to once every 2 weeks.  She states she is "not quite there yet but has cut back.  Otherwise she feels she eats a fairly balanced diet.   Walks daily for 30 minutes.  Also goes to senior ctr 3-4 days a wk.  Goals   None    Depression Screen PHQ 2/9 Scores 06/03/2020 04/14/2020 09/07/2019 05/29/2019  PHQ - 2 Score 1 1 2 2   PHQ- 9 Score - - 3 5  Some encounter information is confidential and restricted. Go to Review Flowsheets activity to see all data.    Fall Risk Fall Risk  06/03/2020 09/07/2019  Falls in the past year? 0 0    Any stairs in or around the home? Yes  yes If so, are there any without handrails? Yes  yes Home free of loose throw rugs in walkways, pet beds, electrical cords, etc? Yes  yes Adequate lighting in your home to reduce risk of falls? Yes  yes  ASSISTIVE DEVICES UTILIZED TO PREVENT FALLS:  Life alert? No  no but carries cell phone hung around her neck at all times .   Use of a cane, walker or w/c? No  Grab bars in the bathroom? Yes   Shower chair or bench in shower? Yes   Elevated toilet seat or a handicapped toilet? No  And does not feel she needs 1.  TIMED UP AND GO:  Was the test  performed? Yes . Length of time to ambulate 10 feet: 6 sec.   Gait steady and fast without use of assistive device  Cognitive Function: MMSE - Mini Mental State Exam 06/03/2020  Orientation to time 5  Orientation to Place 5  Registration 3  Attention/ Calculation 5  Recall 3  Language- name 2 objects 2  Language- repeat 1  Language- follow 3 step command 3  Language- read & follow direction 1  Write a sentence 1  Copy design 1  Total score 30        Immunizations Immunization History  Administered Date(s) Administered  . Hep A / Hep B 11/24/2018, 05/21/2019  . Hepatitis A, Adult 03/15/2018  . Influenza, High Dose Seasonal PF 08/14/2019  . PFIZER SARS-COV-2 Vaccination 01/14/2020, 02/09/2020  . Pneumococcal Conjugate-13 03/15/2018  . Tdap 03/15/2018  . Zoster 06/04/2012  . Zoster Recombinat (Shingrix) 09/16/2018, 11/19/2018     TDAP status: Up to date Flu Vaccine status: Up to date Pneumococcal vaccine status: Up to date Covid-19 vaccine status: Completed vaccines  Qualifies for Shingles Vaccine? Yes   Zostavax completed Yes   Shingrix Completed?: Yes  Screening Tests Health Maintenance  Topic Date Due  . Hepatitis C Screening  Never done  . DEXA SCAN  Never done  . INFLUENZA VACCINE  07/10/2020  . COLONOSCOPY  12/30/2021  . MAMMOGRAM  01/03/2022  . TETANUS/TDAP  03/15/2028  . COVID-19 Vaccine  Completed  . PNA vac Low Risk Adult  Completed    Health Maintenance  Health Maintenance Due  Topic Date Due  . Hepatitis C Screening  Never done  . DEXA SCAN  Never done    Colorectal cancer screening:  Completed 12/31/2011. Repeat every 10 years Mammogram status: Completed 01/04/2020. Repeat every year Bone Density status: Ordered and scheduled for next month already. Pt provided with contact info and advised to call to schedule appt. scheduled  Lung Cancer Screening: (Low Dose CT Chest recommended if Age 4-80 years, 30 pack-year currently smoking OR have  quit w/in 15years.) does not qualify.   Lung Cancer Screening Referral: NA  Additional Screening:  Hepatitis C Screening: does qualify; Completed today  Vision Screening: Recommended annual ophthalmology exams for early detection of glaucoma and other disorders of the eye. Is the patient up to date with their annual eye exam?  Yes  yes - [place] Who is the provider or what is the name of the office in which the patient attends annual eye exams? Dr. Katy Fitch If pt is not established with a provider, would they like to be referred to a provider to establish care? NA.   Dental Screening: Recommended annual dental exams for proper oral hygiene.  Yes.  Dr. Mariel Sleet  Community Resource Referral / Chronic Care Management: CRR required this visit?  No   CCM required this visit?  No      Plan:     I have personally reviewed and noted the following in the patient's chart:   . Medical and social history . Use of alcohol, tobacco or illicit drugs  . Current medications and supplements . Functional ability and status . Nutritional status . Physical activity . Advanced directives . List of other physicians . Hospitalizations, surgeries, and ER visits in previous 12 months . Vitals . Screenings to include cognitive, depression, and falls . Referrals and appointments  In addition, I have reviewed and discussed with patient certain preventive protocols, quality metrics, and best practice recommendations. A written personalized care plan for preventive services as well as general preventive health recommendations were provided to patient.     Karle Plumber, MD   06/03/2020

## 2020-06-03 NOTE — Patient Instructions (Signed)
Please consider giving [place] a copy of your advanced directive so that we can have it on your record.   Healthy Eating Following a healthy eating pattern may help you to achieve and maintain a healthy body weight, reduce the risk of chronic disease, and live a long and productive life. It is important to follow a healthy eating pattern at an appropriate calorie level for your body. Your nutritional needs should be met primarily through food by choosing a variety of nutrient-rich foods. What are tips for following this plan? Reading food labels  Read labels and choose the following: ? Reduced or low sodium. ? Juices with 100% fruit juice. ? Foods with low saturated fats and high polyunsaturated and monounsaturated fats. ? Foods with whole grains, such as whole wheat, cracked wheat, brown rice, and wild rice. ? Whole grains that are fortified with folic acid. This is recommended for women who are pregnant or who want to become pregnant.  Read labels and avoid the following: ? Foods with a lot of added sugars. These include foods that contain brown sugar, corn sweetener, corn syrup, dextrose, fructose, glucose, high-fructose corn syrup, honey, invert sugar, lactose, malt syrup, maltose, molasses, raw sugar, sucrose, trehalose, or turbinado sugar.  Do not eat more than the following amounts of added sugar per day:  6 teaspoons (25 g) for women.  9 teaspoons (38 g) for men. ? Foods that contain processed or refined starches and grains. ? Refined grain products, such as white flour, degermed cornmeal, white bread, and white rice. Shopping  Choose nutrient-rich snacks, such as vegetables, whole fruits, and nuts. Avoid high-calorie and high-sugar snacks, such as potato chips, fruit snacks, and candy.  Use oil-based dressings and spreads on foods instead of solid fats such as butter, stick margarine, or cream cheese.  Limit pre-made sauces, mixes, and "instant" products such as flavored rice,  instant noodles, and ready-made pasta.  Try more plant-protein sources, such as tofu, tempeh, black beans, edamame, lentils, nuts, and seeds.  Explore eating plans such as the Mediterranean diet or vegetarian diet. Cooking  Use oil to saut or stir-fry foods instead of solid fats such as butter, stick margarine, or lard.  Try baking, boiling, grilling, or broiling instead of frying.  Remove the fatty part of meats before cooking.  Steam vegetables in water or broth. Meal planning   At meals, imagine dividing your plate into fourths: ? One-half of your plate is fruits and vegetables. ? One-fourth of your plate is whole grains. ? One-fourth of your plate is protein, especially lean meats, poultry, eggs, tofu, beans, or nuts.  Include low-fat dairy as part of your daily diet. Lifestyle  Choose healthy options in all settings, including home, work, school, restaurants, or stores.  Prepare your food safely: ? Wash your hands after handling raw meats. ? Keep food preparation surfaces clean by regularly washing with hot, soapy water. ? Keep raw meats separate from ready-to-eat foods, such as fruits and vegetables. ? Cook seafood, meat, poultry, and eggs to the recommended internal temperature. ? Store foods at safe temperatures. In general:  Keep cold foods at 29F (4.4C) or below.  Keep hot foods at 129F (60C) or above.  Keep your freezer at [facility] (-17.8C) or below.  Foods are no longer safe to eat when they have been between the temperatures of 40-129F (4.4-60C) for more than 2 hours. What foods should I eat? Fruits Aim to eat 2 cup-equivalents of fresh, canned (in natural juice), or frozen fruits  each day. Examples of 1 cup-equivalent of fruit include 1 small apple, 8 large strawberries, 1 cup canned fruit,  cup dried fruit, or 1 cup 100% juice. Vegetables Aim to eat 2-3 cup-equivalents of fresh and frozen vegetables each day, including different varieties and colors.  Examples of 1 cup-equivalent of vegetables include 2 medium carrots, 2 cups raw, leafy greens, 1 cup chopped vegetable (raw or cooked), or 1 medium baked potato. Grains Aim to eat 6 ounce-equivalents of whole grains each day. Examples of 1 ounce-equivalent of grains include 1 slice of bread, 1 cup ready-to-eat cereal, 3 cups popcorn, or  cup cooked rice, pasta, or cereal. Meats and other proteins Aim to eat 5-6 ounce-equivalents of protein each day. Examples of 1 ounce-equivalent of protein include 1 egg, 1/2 cup nuts or seeds, or 1 tablespoon (16 g) peanut butter. A cut of meat or fish that is the size of a deck of cards is about 3-4 ounce-equivalents.  Of the protein you eat each week, try to have at least 8 ounces come from seafood. This includes salmon, trout, herring, and anchovies. Dairy Aim to eat 3 cup-equivalents of fat-free or low-fat dairy each day. Examples of 1 cup-equivalent of dairy include 1 cup (240 mL) milk, 8 ounces (250 g) yogurt, 1 ounces (44 g) natural cheese, or 1 cup (240 mL) fortified soy milk. Fats and oils  Aim for about 5 teaspoons (21 g) per day. Choose monounsaturated fats, such as canola and olive oils, avocados, peanut butter, and most nuts, or polyunsaturated fats, such as sunflower, corn, and soybean oils, walnuts, pine nuts, sesame seeds, sunflower seeds, and flaxseed. Beverages  Aim for six 8-oz glasses of water per day. Limit coffee to three to five 8-oz cups per day.  Limit caffeinated beverages that have added calories, such as soda and energy drinks.  Limit alcohol intake to no more than 1 drink a day for nonpregnant women and 2 drinks a day for men. One drink equals 12 oz of beer (355 mL), 5 oz of wine (148 mL), or 1 oz of hard liquor (44 mL). Seasoning and other foods  Avoid adding excess amounts of salt to your foods. Try flavoring foods with herbs and spices instead of salt.  Avoid adding sugar to foods.  Try using oil-based dressings, sauces,  and spreads instead of solid fats. This information is based on general [place] nutrition guidelines. For more information, visit Build[employer].es. Exact amounts may vary based on your nutrition needs. Summary  A healthy eating plan may help you to maintain a healthy weight, reduce the risk of chronic diseases, and stay active throughout your life.  Plan your meals. Make sure you eat the right portions of a variety of nutrient-rich foods.  Try baking, boiling, grilling, or broiling instead of frying.  Choose healthy options in all settings, including home, work, school, restaurants, or stores. This information is not intended to replace advice given to you by your health care provider. Make sure you discuss any questions you have with your health care provider. Document Revised: 03/10/2018 Document Reviewed: 03/10/2018 Elsevier Patient Education  [place].

## 2020-06-04 LAB — HEPATITIS C ANTIBODY: Hep C Virus Ab: <0.1 s/co ratio (ref 0.0–0.9)

## 2020-06-29 ENCOUNTER — Telehealth: Payer: Self-pay | Admitting: Internal Medicine

## 2020-06-29 ENCOUNTER — Other Ambulatory Visit: Payer: Self-pay

## 2020-06-29 ENCOUNTER — Ambulatory Visit
Admission: RE | Admit: 2020-06-29 | Discharge: 2020-06-29 | Disposition: A | Discharge status: Home / Self Care | Payer: Medicare Other | Source: Ambulatory Visit | Attending: Internal Medicine | Admitting: Internal Medicine

## 2020-06-29 DIAGNOSIS — Z780 Asymptomatic menopausal state: Secondary | ICD-10-CM

## 2020-06-29 NOTE — Telephone Encounter (Signed)
Attempted to reach patient today to discuss the results of her bone density study.  I left a message informing her of who I am and my reason for calling.  I told her that I will send the results to her MyChart account.  If she is unable to access the MyChart account, she can call and speak with my medical assistant to get her results.

## 2020-07-06 IMAGING — DX LEFT WRIST - COMPLETE 3+ VIEW
4 series · 4 of 4 positions shown · non-contrast
Comparison: None.

CLINICAL DATA: Fall.  Wrist deformity

EXAM:
LEFT WRIST - COMPLETE 3+ VIEW

[wrist pa]
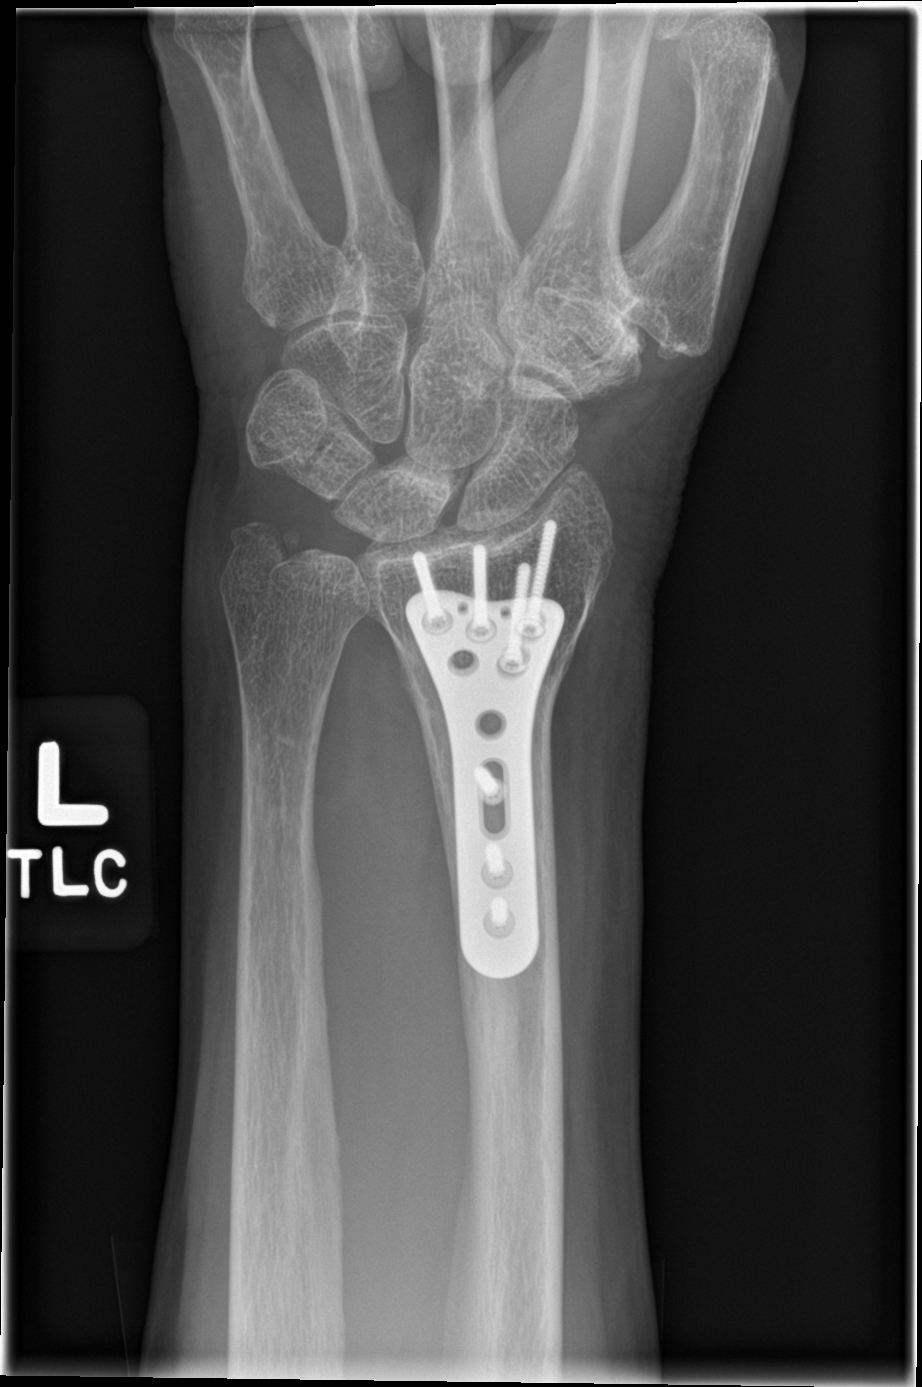

[wrist obl]
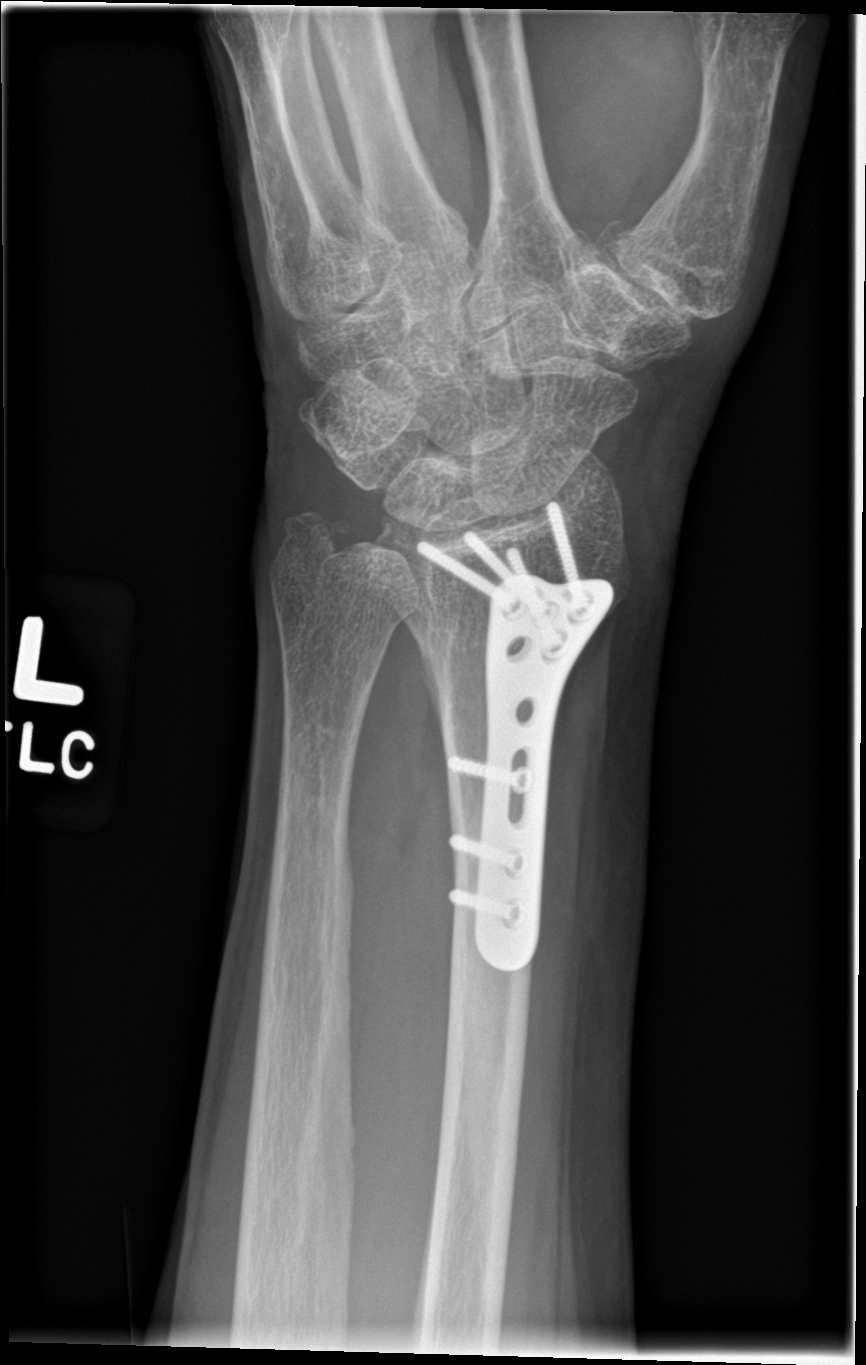

[wrist lat]
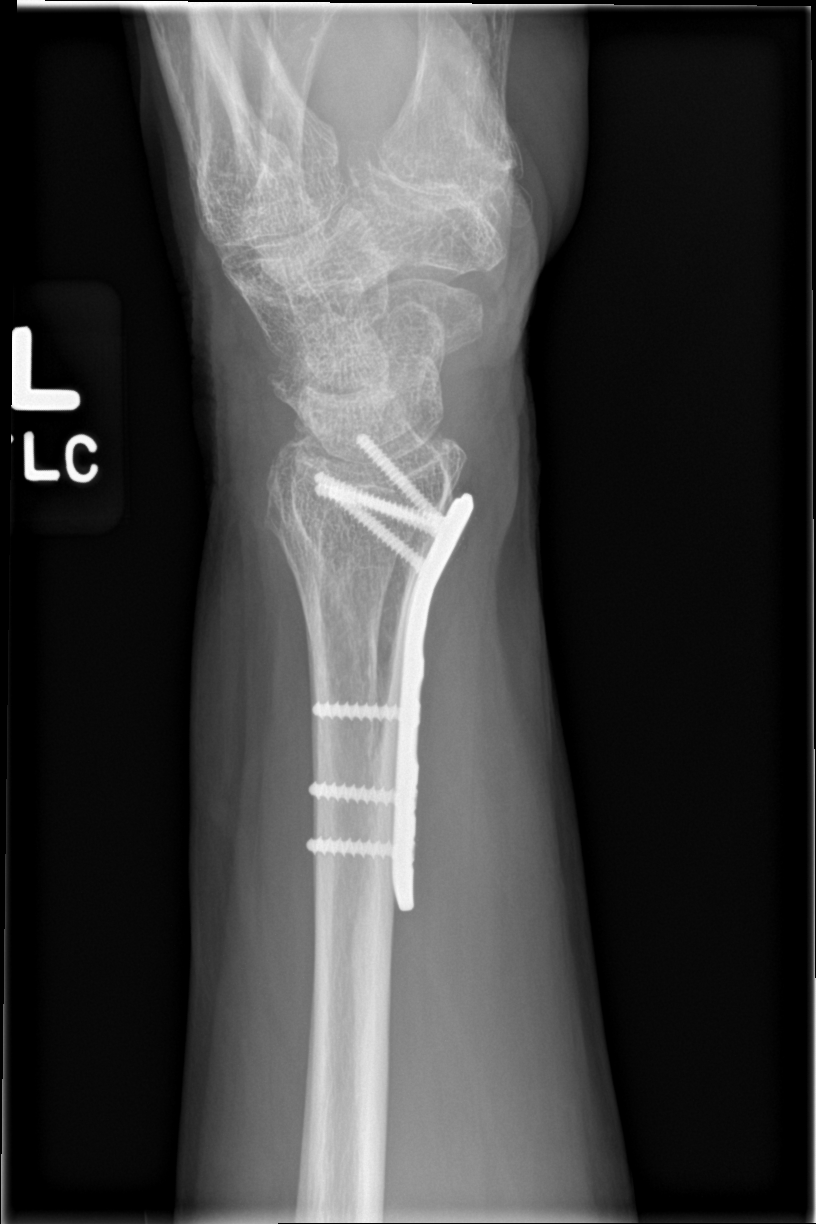

[wrist navicular]
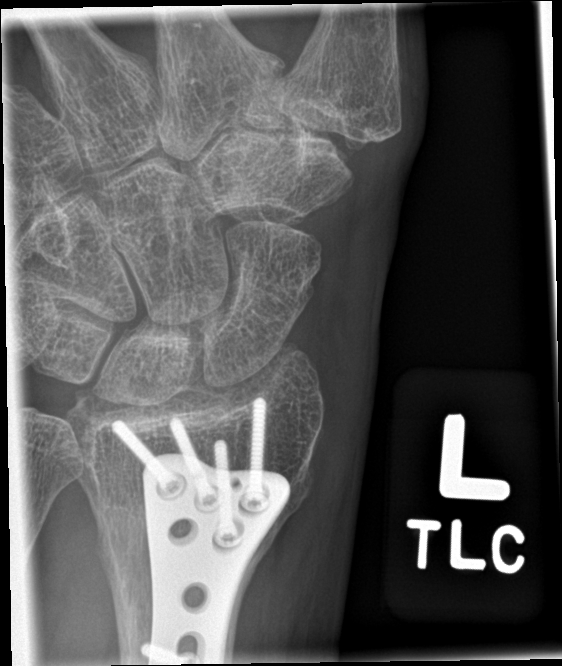

[4 of 4 positions shown; findings below may reference images not displayed]

FINDINGS: Surgical plate fixation of distal radial fracture. No acute
fracture. Chronic fracture of the ulnar styloid. Wrist joint and
carpal bones appear normal.
IMPRESSION: Healed fracture distal radius and ulna.  No acute fracture.

## 2020-07-06 IMAGING — DX PORTABLE RIGHT SHOULDER
2 series · 2 of 2 positions shown · non-contrast
Comparison: None.

CLINICAL DATA: Fall.

EXAM:
PORTABLE RIGHT SHOULDER

[shoulder axial]
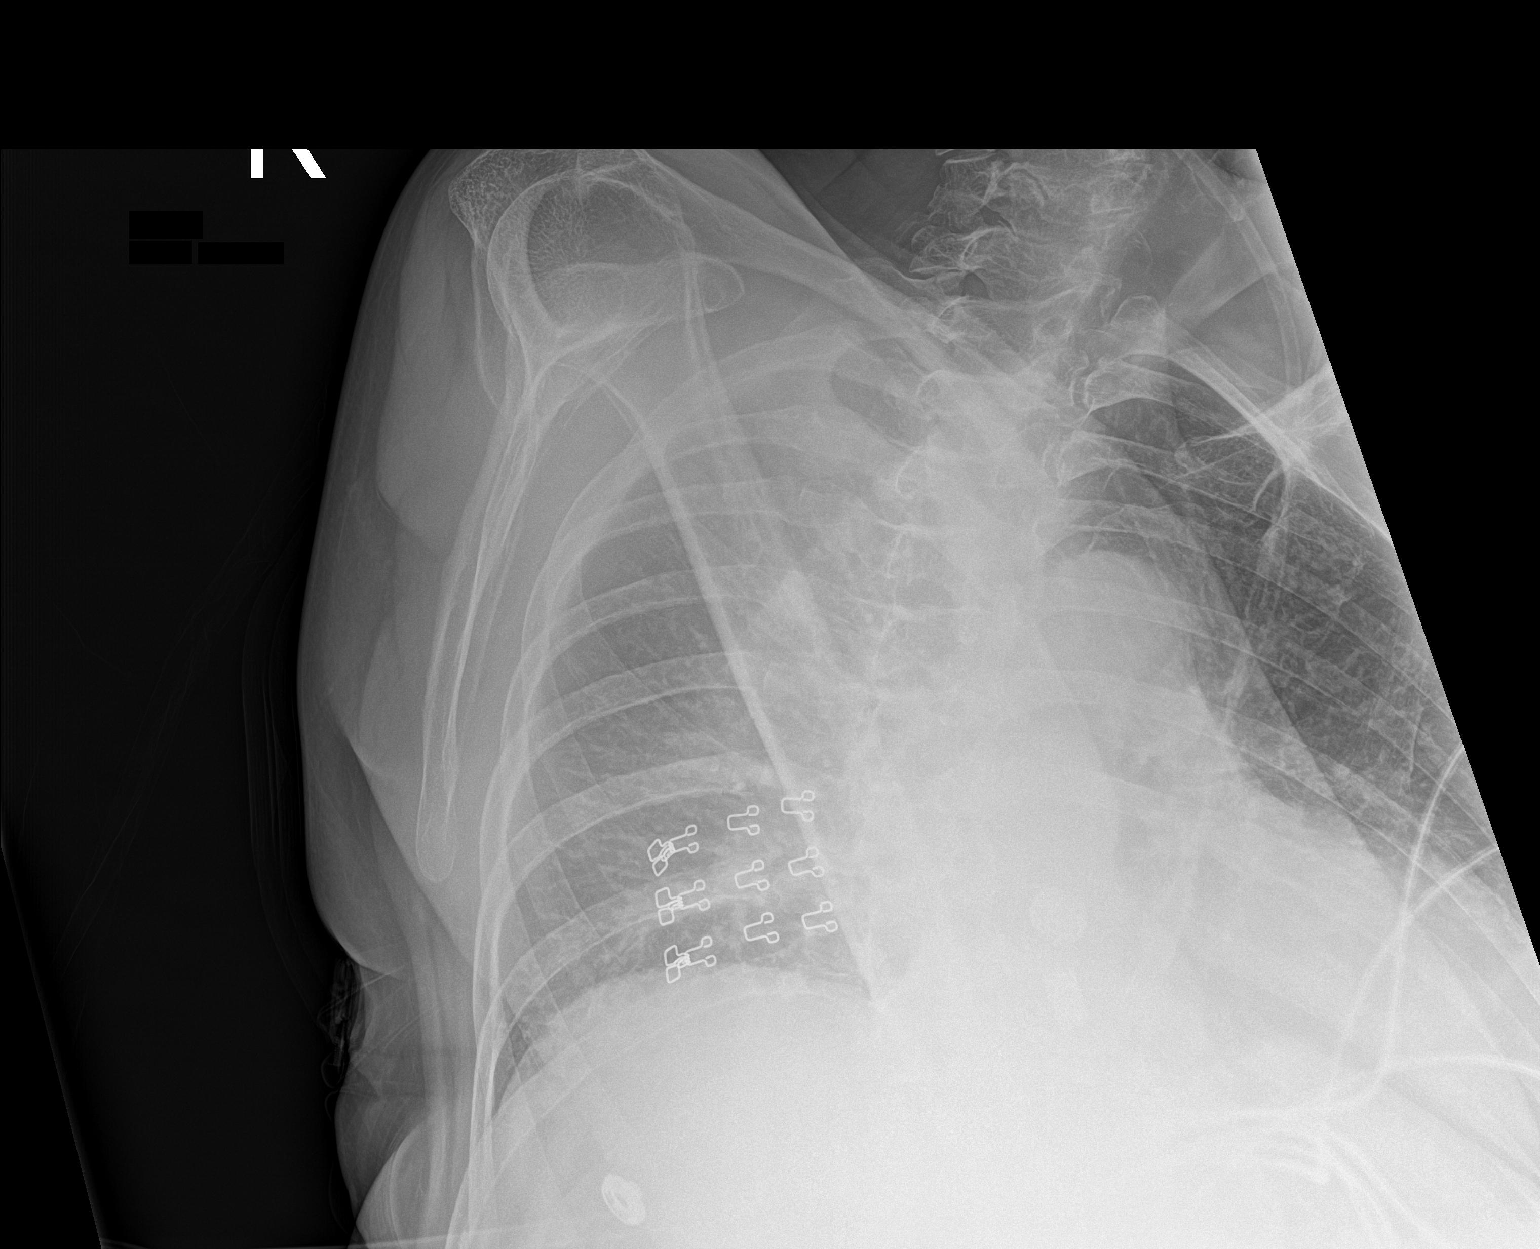

[shoulder ap]
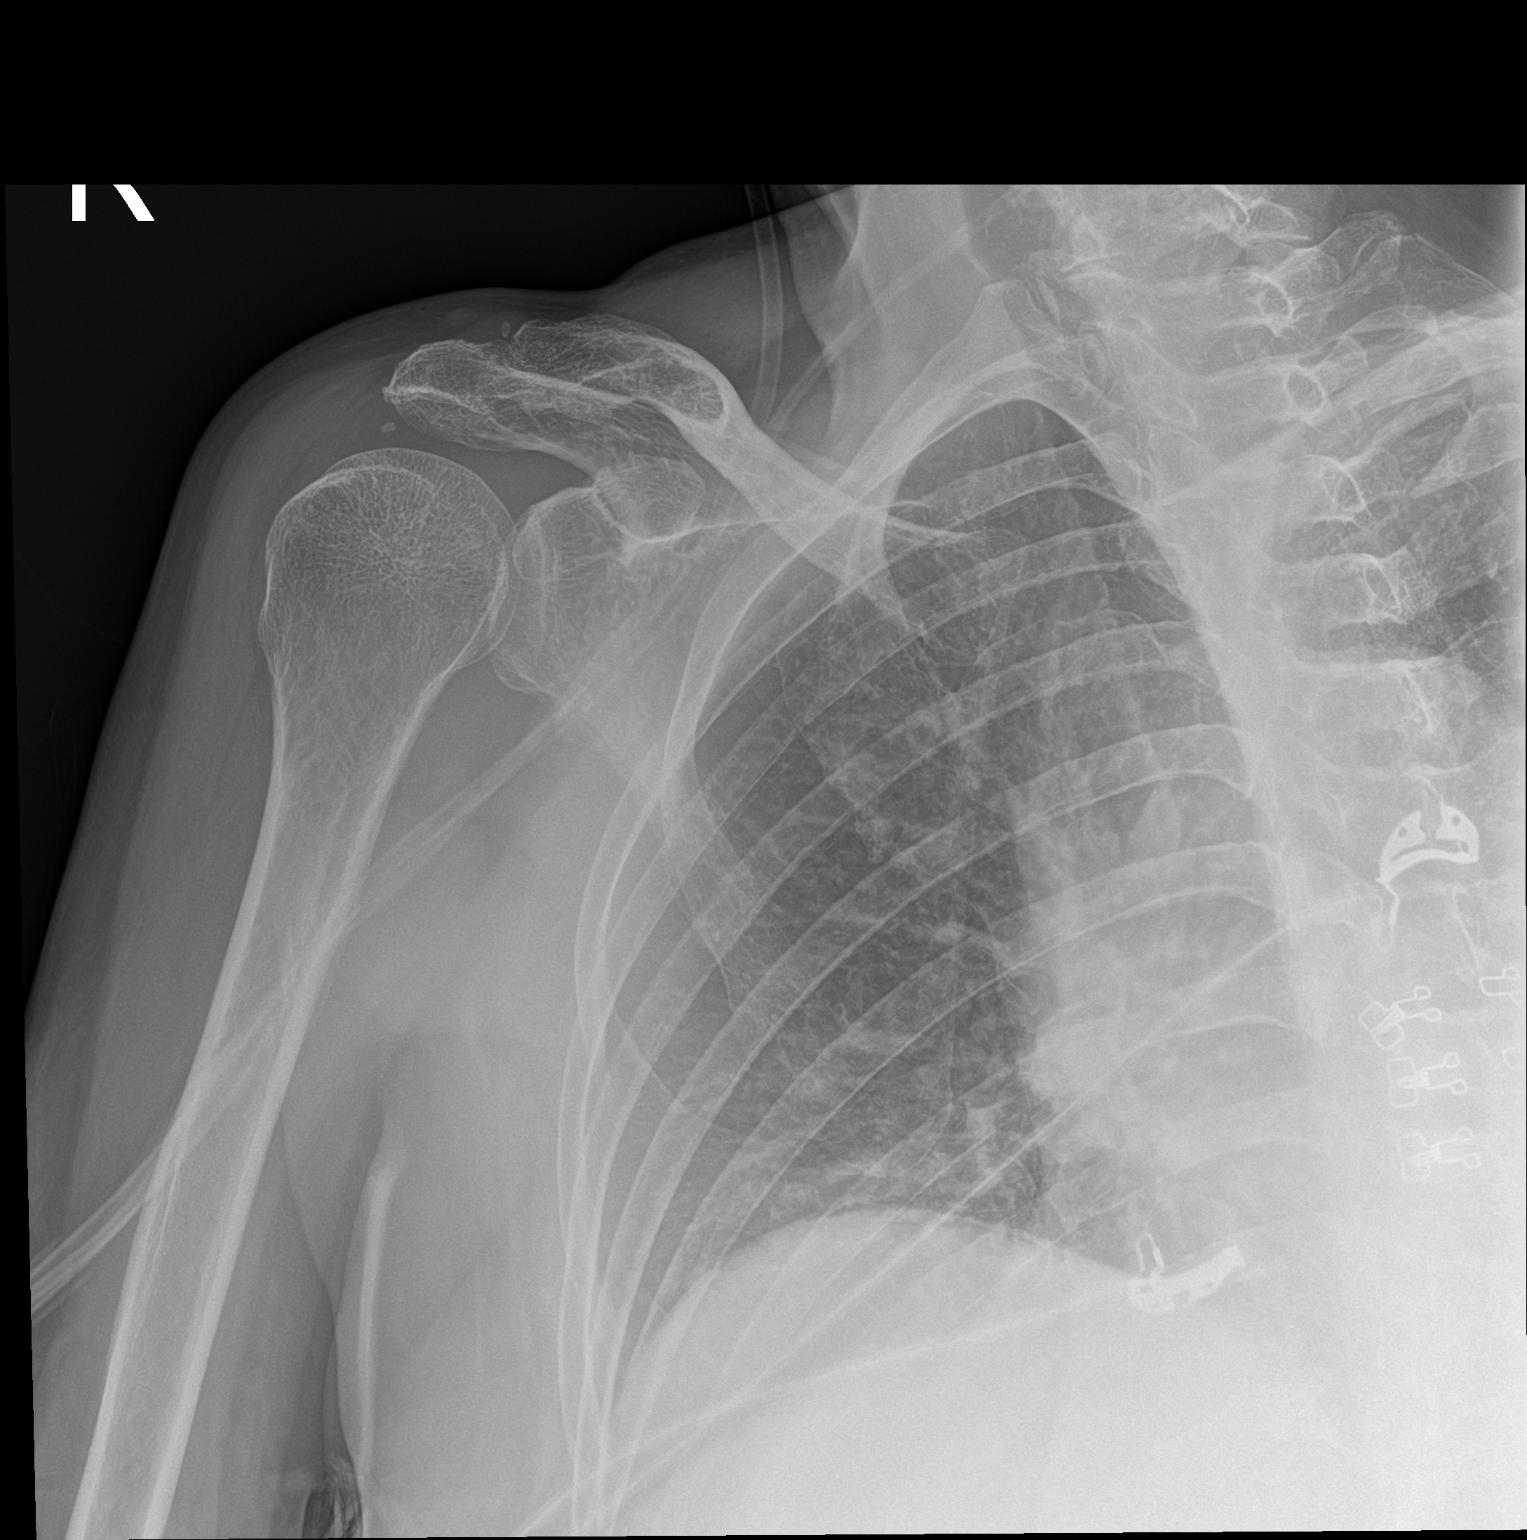

[2 of 2 positions shown; findings below may reference images not displayed]

FINDINGS: Degenerative changes in the right AC joint with joint space
narrowing and spurring. No acute bony abnormality. Specifically, no
fracture, subluxation, or dislocation.
IMPRESSION: No acute bony abnormality.

## 2020-08-22 ENCOUNTER — Other Ambulatory Visit: Payer: Self-pay | Admitting: Internal Medicine

## 2020-08-22 DIAGNOSIS — I10 Essential (primary) hypertension: Secondary | ICD-10-CM

## 2020-08-24 ENCOUNTER — Encounter: Payer: Self-pay | Admitting: Internal Medicine

## 2020-08-24 NOTE — Progress Notes (Signed)
I received pathology report for an endometrial biopsy that was done 12/23/2019.  Report reads endometrial biopsy: Benign atrophic endometrium, negative for hyperplasia or malignancy.

## 2020-09-15 ENCOUNTER — Telehealth (INDEPENDENT_AMBULATORY_CARE_PROVIDER_SITE_OTHER): Payer: Medicare Other | Admitting: Psychiatry

## 2020-09-15 ENCOUNTER — Other Ambulatory Visit: Payer: Self-pay

## 2020-09-15 DIAGNOSIS — F411 Generalized anxiety disorder: Secondary | ICD-10-CM

## 2020-09-15 DIAGNOSIS — F3342 Major depressive disorder, recurrent, in full remission: Secondary | ICD-10-CM

## 2020-09-15 MED ORDER — SERTRALINE HCL 100 MG PO TABS: 150.0000 mg | ORAL_TABLET | Freq: Every day | ORAL | 1 refills | Status: DC

## 2020-09-15 NOTE — Progress Notes (Signed)
[place] MD/PA/NP OP Progress Note  09/15/2020 10:12 AM Ger Nicks Farrel  MRN:  [id number] Interview was conducted by phone and I verified that I was speaking with the correct person using two identifiers. I discussed the limitations of evaluation and management by telemedicine and  the availability of in person appointments. Patient expressed understanding and agreed to proceed. Patient location - home; physician - home office.  Chief Complaint: None.  HPI: 69yo widowed female withGAD and major depression.Moodnormalizedafter dose of sertraline was increased to 150 mg but Izora Gala initially reportedmore problems with sleep after Seroquel dose was deceased by 25 mg to 50 mg at HS.She now sleeping 7-8 hours uninterrupted with it. No SI, no psychosis. She hasdecided that she will split time between [place] and [place] where some of her family live. She is still in a process of selling her apartment in [place] and will buy a house in [place] once this is concluded.Supportive son and daughter-in-law locally.Janus does not desire to change hersertralineas her mood and sleep have improved.    Visit Diagnosis:    ICD-10-CM   1. Major depressive disorder, recurrent episode, in full remission ([place])  F33.42   2. GAD (generalized anxiety disorder)  F41.1     Past Psychiatric History: Please see intake H&P.  Past Medical History:  Past Medical History:  Diagnosis Date  . Anxiety   . Depression   . Hypertension 2016    Past Surgical History:  Procedure Laterality Date  . COLON RESECTION N/A 11/2009   for presumed cancerous polyp that turned out not to be CA  . COLON SURGERY  2010   Colon Resection  . COLON SURGERY  2011   Ostomy reversal   . polypectomy uterous  2012  . WRIST FRACTURE SURGERY Bilateral 2018, 03/2019   LT 2018 and RT 2020  . WRIST SURGERY Left     Family Psychiatric History: Reviewed.  Family History:  Family History  Problem Relation Age of Onset  . Anxiety  disorder Mother   . Depression Mother   . Alcohol abuse Father   . Anxiety disorder Sister   . Depression Sister   . Breast cancer Sister   . Anxiety disorder Maternal Grandmother   . Depression Maternal Grandmother     Social History:  Social History   Socioeconomic History  . Marital status: Widowed    Spouse name: Not on file  . Number of children: 3  . Years of education: BS degree  . Highest education level: Not on file  Occupational History  . Occupation: retired Pharmacist, hospital  Tobacco Use  . Smoking status: Never Smoker  . Smokeless tobacco: Never Used  Vaping Use  . Vaping Use: Never used  Substance and Sexual Activity  . Alcohol use: Not Currently  . Drug use: Not Currently  . Sexual activity: Not Currently  Other Topics Concern  . Not on file  Social History Narrative  . Not on file   Social Determinants of Health   Financial Resource Strain:   . Difficulty of Paying Living Expenses: Not on file  Food Insecurity:   . Worried About Charity fundraiser in the Last Year: Not on file  . Ran Out of Food in the Last Year: Not on file  Transportation Needs:   . Lack of Transportation (Medical): Not on file  . Lack of Transportation (Non-Medical): Not on file  Physical Activity:   . Days of Exercise per Week: Not on file  . Minutes of Exercise  per Session: Not on file  Stress:   . Feeling of Stress : Not on file  Social Connections:   . Frequency of Communication with Friends and Family: Not on file  . Frequency of Social Gatherings with Friends and Family: Not on file  . Attends Religious Services: Not on file  . Active Member of Clubs or Organizations: Not on file  . Attends Archivist Meetings: Not on file  . Marital Status: Not on file    Allergies:  Allergies  Allergen Reactions  . Penicillin V Potassium Other (See Comments)    Reaction happened as a baby- not recalled Did it involve swelling of the face/tongue/throat, SOB, or low BP?  Unk Did it involve sudden or severe rash/hives, skin peeling, or any reaction on the inside of your mouth or nose? Unk Did you need to seek medical attention at a hospital or doctor's office? Unk When did it last happen?"I was a baby" If all above answers are "NO", may proceed with cephalosporin use.    Marland Kitchen Penicillins Other (See Comments)    Reaction happened as a baby- not recalled Did it involve swelling of the face/tongue/throat, SOB, or low BP? Unk Did it involve sudden or severe rash/hives, skin peeling, or any reaction on the inside of your mouth or nose? Unk Did you need to seek medical attention at a hospital or doctor's office? Unk When did it last happen?"I was a baby" If all above answers are "NO", may proceed with cephalosporin use.  . Latex   . Adhesive [Tape] Rash    Terrible rash developed at incision site    Metabolic Disorder Labs: No results found for: HGBA1C, MPG No results found for: PROLACTIN Lab Results  Component Value Date   CHOL 216 (H) 06/24/2019   TRIG 165 (H) 06/24/2019   HDL 49 06/24/2019   CHOLHDL 4.4 06/24/2019   LDLCALC 134 (H) 06/24/2019   No results found for: TSH  Therapeutic Level Labs: No results found for: LITHIUM No results found for: VALPROATE No components found for:  CBMZ  Current Medications: Current Outpatient Medications  Medication Sig Dispense Refill  . amLODipine (NORVASC) 5 MG tablet TAKE 1 TABLET BY MOUTH EVERY DAY 90 tablet 0  . diclofenac sodium (VOLTAREN) 1 % GEL Apply 2 g topically 4 (four) times daily as needed. 100 g 1  . lisinopril (ZESTRIL) 20 MG tablet Take 1 tablet (20 mg total) by mouth daily. 90 tablet 1  . Multiple Vitamins-Minerals (ONE-A-DAY VITACRAVES ADULT) CHEW Chew 1 tablet by mouth 2 (two) times a day.    . Omega-3 Fatty Acids (FISH OIL PO) Take 1 capsule by mouth daily with breakfast.    . QUEtiapine (SEROQUEL) 50 MG tablet Take 1 tablet (50 mg total) by mouth at bedtime. 90 tablet 1  . sertraline  (ZOLOFT) 100 MG tablet Take 1.5 tablets (150 mg total) by mouth at bedtime. 135 tablet 1   No current facility-administered medications for this visit.      Psychiatric Specialty Exam: Review of Systems  All other systems reviewed and are negative.   There were no vitals taken for this visit.There is no height or weight on file to calculate BMI.  General Appearance: NA  Eye Contact:  NA  Speech:  Clear and Coherent and Normal Rate  Volume:  Normal  Mood:  Euthymic  Affect:  NA  Thought Process:  Goal Directed and Linear  Orientation:  Full (Time, Place, and Person)  Thought Content:  Logical   Suicidal Thoughts:  No  Homicidal Thoughts:  No  Memory:  Immediate;   Good Recent;   Good Remote;   Good  Judgement:  Good  Insight:  Good  Psychomotor Activity:  NA  Concentration:  Concentration: Good  Recall:  Good  Fund of Knowledge: Good  Language: Good  Akathisia:  Negative  Handed:  Right  AIMS (if indicated): not done  Assets:  Communication Skills Desire for Improvement Financial Resources/Insurance Housing Leisure Time Social Support  ADL's:  Intact  Cognition: WNL  Sleep:  Good   Screenings: GAD-7     Office Visit from 04/14/2020 in [place] Office Visit from 09/07/2019 in [facility] Office Visit from 05/29/2019 in [facility] Counsel[facility] from 11/21/2018 in [facility] Counsel[facility] from 11/05/2018 in [facility]  Total GAD-7 Score 2 4 4 6 13     Mini-Mental     Office Visit from 06/03/2020 in [facility]  Total Score (max 30 points ) 30    PHQ2-9     Office Visit from 06/03/2020 in [facility] Office Visit from 04/14/2020 in [facility] Office Visit from 09/07/2019 in [place] Office  Visit from 05/29/2019 in [place] Counsel[facility] from 11/21/2018 in [facility]  PHQ-2 Total Score 1 1 2 2 2   PHQ-9 Total Score -- -- 3 5 8        Assessment and Plan: 69yo widowed female withGAD and major depression.Moodnormalizedafter dose of sertraline was increased to 150 mg but Izora Gala initially reportedmore problems with sleep after Seroquel dose was deceased by 25 mg to 50 mg at HS.She now sleeping 7-8 hours uninterrupted with it. No SI, no psychosis. She hasdecided that she will split time between [place] and [place] where some of her family live. She is still in a process of selling her apartment in [place] and will buy a house in [place] once this is concluded.Supportive son and daughter-in-law locally.Lailie does not desire to change hersertralineas her mood and sleep have improved.   Dx: GAD in remission; MDD recurrent in remission  Plan: Continue sertraline 150 mgin PM, quetiapine 50 mg at HS. Next appointment in67months or prn.The plan was discussed with patient who had an opportunity to ask questions and these were all answered. I spend15 minutes inphone consultation with the patient.    Stephanie Acre, MD 09/15/2020, 10:12 AM

## 2020-10-03 ENCOUNTER — Other Ambulatory Visit: Payer: Self-pay

## 2020-10-03 ENCOUNTER — Ambulatory Visit: Payer: Medicare Other | Attending: Internal Medicine | Admitting: Internal Medicine

## 2020-10-03 ENCOUNTER — Other Ambulatory Visit: Payer: Self-pay | Admitting: Internal Medicine

## 2020-10-03 ENCOUNTER — Encounter: Payer: Self-pay | Admitting: Internal Medicine

## 2020-10-03 VITALS — BP 114/72 | HR 76

## 2020-10-03 DIAGNOSIS — E663 Overweight: Secondary | ICD-10-CM

## 2020-10-03 DIAGNOSIS — E569 Vitamin deficiency, unspecified: Secondary | ICD-10-CM

## 2020-10-03 DIAGNOSIS — E782 Mixed hyperlipidemia: Secondary | ICD-10-CM

## 2020-10-03 DIAGNOSIS — I10 Essential (primary) hypertension: Secondary | ICD-10-CM | POA: Diagnosis not present

## 2020-10-03 NOTE — Progress Notes (Signed)
Virtual Visit via Telephone Note Due to current restrictions/limitations of in-office visits due to the COVID-19 pandemic, this scheduled clinical appointment was converted to a telehealth visit  I connected with [facility] Padilla on 10/03/20 at 11:20 a,m by telephone and verified that I am speaking with the correct person using two identifiers.  Location: Patient: home Provider: Office [place]   I discussed the limitations, risks, security and privacy concerns of performing an evaluation and management service by telephone and the availability of in person appointments. I also discussed with the patient that there may be a patient responsible charge related to this service. The patient expressed understanding and agreed to proceed.   History of Present Illness: Pt with hx ofGAD/MDD followed by psychiatry, HTN, HL,hx of partial colon resection.  Last seen 05/2020.  Purpose of today's visit is chronic disease management.  Since last visit she states that she has been staying active and trying to control her eating habits to get her weight back down.  On last visit her BMI was 28.  She goes to water aerobics 2 times a week, she has joined a hiking club and hikes 2 to 3 miles once a week.  On other days she does exercises at home for 20 to 30 minutes.  Overall she does well with her eating habits but admits that chocolate is her weakness.  She tries not to overdo it however.  HTN: Reports compliance with amlodipine and lisinopril.  Blood pressure this morning was 114/72 with pulse of 76.  She limits salt in the foods.  No chest pains or shortness of breath.  Anxiety/depression.  She is plugged into mental health services.  She is doing well on current medications that include Seroquel and Zoloft.  Vitamin D deficiency.  She takes 2000 IU daily.  Due for routine blood test including vitamin D level, lipid profile etc.  Outpatient Encounter Medications as of 10/03/2020  Medication Sig  . amLODipine  (NORVASC) 5 MG tablet TAKE 1 TABLET BY MOUTH EVERY DAY  . diclofenac sodium (VOLTAREN) 1 % GEL Apply 2 g topically 4 (four) times daily as needed.  Marland Kitchen lisinopril (ZESTRIL) 20 MG tablet TAKE 1 TABLET BY MOUTH EVERY DAY  . Multiple Vitamins-Minerals (ONE-A-DAY VITACRAVES ADULT) CHEW Chew 1 tablet by mouth 2 (two) times a day.  . Omega-3 Fatty Acids (FISH OIL PO) Take 1 capsule by mouth daily with breakfast.  . QUEtiapine (SEROQUEL) 50 MG tablet Take 1 tablet (50 mg total) by mouth at bedtime.  . sertraline (ZOLOFT) 100 MG tablet Take 1.5 tablets (150 mg total) by mouth at bedtime.   No facility-administered encounter medications on file as of 10/03/2020.     Observations/Objective: Vitals:   10/03/20 1135  BP: 114/72  Pulse: 76    Wt Readings from Last 3 Encounters:  06/03/20 193 lb 6.4 oz (87.7 kg)  04/14/20 194 lb (88 kg)  10/19/19 190 lb 6.4 oz (86.4 kg)   Depression screen [facility] 2/9 10/03/2020 06/03/2020 04/14/2020  Decreased Interest 0 0 1  Down, Depressed, Hopeless 0 1 0  PHQ - 2 Score 0 1 1  Altered sleeping - - -  Tired, decreased energy - - -  Change in appetite - - -  Feeling bad or failure about yourself  - - -  Trouble concentrating - - -  Moving slowly or fidgety/restless - - -  Suicidal thoughts - - -  PHQ-9 Score - - -  Some encounter information is confidential and restricted.  Go to Review Flowsheets activity to see all data.     Assessment and Plan: 1. Essential hypertension At goal.  Continue current medications and low-salt diet. - CBC; Future - Comprehensive metabolic panel; Future  2. Over weight Commended her on staying active and trying to eat healthy.  Discussed the importance of both especially as she gets older as it will contribute to good physical but also mental health.  3. Vitamin deficiency - VITAMIN D 25 Hydroxy (Vit-D Deficiency, Fractures); Future  4. Mixed hyperlipidemia - Lipid panel; Future   Follow Up Instructions: 4 mths   I  discussed the assessment and treatment plan with the patient. The patient was provided an opportunity to ask questions and all were answered. The patient agreed with the plan and demonstrated an understanding of the instructions.   The patient was advised to call back or seek an in-person evaluation if the symptoms worsen or if the condition fails to improve as anticipated.  I provided 13 minutes of non-face-to-face time during this encounter.   Karle Plumber, MD

## 2020-10-10 ENCOUNTER — Other Ambulatory Visit: Payer: Medicare Other

## 2020-11-13 ENCOUNTER — Other Ambulatory Visit: Payer: Self-pay | Admitting: Internal Medicine

## 2020-11-13 DIAGNOSIS — I10 Essential (primary) hypertension: Secondary | ICD-10-CM

## 2020-11-13 NOTE — Telephone Encounter (Signed)
Requested Prescriptions  Pending Prescriptions Disp Refills  . amLODipine (NORVASC) 5 MG tablet [Pharmacy Med Name: AMLODIPINE BESYLATE 5 MG TAB] 90 tablet 0    Sig: TAKE 1 TABLET BY MOUTH EVERY DAY     Cardiovascular:  Calcium Channel Blockers Passed - 11/13/2020  9:52 AM      Passed - Last BP in normal range    BP Readings from Last 1 Encounters:  10/03/20 114/72         Passed - Valid encounter within last 6 months    Recent Outpatient Visits          1 month ago Essential hypertension   [place], MD   5 months ago Encounter for [employer] annual wellness exam   [place] Ladell [place], MD   7 months ago Essential hypertension   [place], MD   11 months ago Essential hypertension   [place], Deborah B, MD   1 year ago Essential hypertension   [place], MD      Future Appointments            In 2 months Wynetta Emery Dalbert Batman, MD [place]

## 2020-11-16 ENCOUNTER — Ambulatory Visit: Payer: Medicare Other | Attending: Internal Medicine

## 2020-11-16 ENCOUNTER — Other Ambulatory Visit: Payer: Self-pay

## 2020-11-16 DIAGNOSIS — E569 Vitamin deficiency, unspecified: Secondary | ICD-10-CM

## 2020-11-16 DIAGNOSIS — E782 Mixed hyperlipidemia: Secondary | ICD-10-CM

## 2020-11-16 DIAGNOSIS — I10 Essential (primary) hypertension: Secondary | ICD-10-CM

## 2020-11-17 ENCOUNTER — Other Ambulatory Visit: Payer: Self-pay | Admitting: Internal Medicine

## 2020-11-17 LAB — COMPREHENSIVE METABOLIC PANEL
ALT: 14 IU/L (ref 0–32)
AST: 17 IU/L (ref 0–40)
Albumin/Globulin Ratio: 1.9 (ref 1.2–2.2)
Albumin: 4.3 g/dL (ref 3.8–4.8)
Alkaline Phosphatase: 61 IU/L (ref 44–121)
BUN/Creatinine Ratio: 16 (ref 12–28)
BUN: 14 mg/dL (ref 8–27)
Bilirubin Total: 0.4 mg/dL (ref 0.0–1.2)
CO2: 25 mmol/L (ref 20–29)
Calcium: 8.9 mg/dL (ref 8.7–10.3)
Chloride: 103 mmol/L (ref 96–106)
Creatinine, Ser: 0.89 mg/dL (ref 0.57–1.00)
GFR calc Af Amer: 77 mL/min/{1.73_m2} (ref >59)
GFR calc non Af Amer: 67 mL/min/{1.73_m2} (ref >59)
Globulin, Total: 2.3 g/dL (ref 1.5–4.5)
Glucose: 93 mg/dL (ref 65–99)
Potassium: 3.7 mmol/L (ref 3.5–5.2)
Sodium: 143 mmol/L (ref 134–144)
Total Protein: 6.6 g/dL (ref 6.0–8.5)

## 2020-11-17 LAB — VITAMIN D 25 HYDROXY (VIT D DEFICIENCY, FRACTURES): Vit D, 25-Hydroxy: 23.8 ng/mL — ABNORMAL LOW (ref 30.0–100.0)

## 2020-11-17 LAB — CBC
Hematocrit: 39.4 % (ref 34.0–46.6)
Hemoglobin: 12.9 g/dL (ref 11.1–15.9)
MCH: 28.5 pg (ref 26.6–33.0)
MCHC: 32.7 g/dL (ref 31.5–35.7)
MCV: 87 fL (ref 79–97)
Platelets: 149 10*3/uL — ABNORMAL LOW (ref 150–450)
RBC: 4.52 x10E6/uL (ref 3.77–5.28)
RDW: 13.8 % (ref 11.7–15.4)
WBC: 4.9 10*3/uL (ref 3.4–10.8)

## 2020-11-17 LAB — LIPID PANEL
Chol/HDL Ratio: 4.3 ratio (ref 0.0–4.4)
Cholesterol, Total: 270 mg/dL — ABNORMAL HIGH (ref 100–199)
HDL: 63 mg/dL (ref >39)
LDL Chol Calc (NIH): 183 mg/dL — ABNORMAL HIGH (ref 0–99)
Triglycerides: 132 mg/dL (ref 0–149)
VLDL Cholesterol Cal: 24 mg/dL (ref 5–40)

## 2020-11-17 MED ORDER — VITAMIN D 25 MCG (1000 UNIT) PO TABS: 2000.0000 [IU] | ORAL_TABLET | Freq: Every day | ORAL | 1 refills | Status: AC

## 2020-11-17 NOTE — Telephone Encounter (Signed)
Copied from [place] [id number]. Topic: Quick Communication - Rx Refill/Question >> Nov 17, 2020  1:38 PM Leward Quan A wrote: Medication: cholecalciferol (VITAMIN D3) 25 MCG (1000 UNIT) tablet  Has the patient contacted their pharmacy? Yes.   (Agent: If no, request that the patient contact the pharmacy for the refill.) (Agent: If yes, when and what did the pharmacy advise?)  Preferred Pharmacy (with phone number or street name): [facility], [place]  Phone:  [phone] Fax:  [phone]     Agent: Please be advised that RX refills may take up to 3 business days. We ask that you follow-up with your pharmacy.

## 2020-11-17 NOTE — Telephone Encounter (Signed)
Requested medication (s) are due for refill today: yes  Requested medication (s) are on the active medication list: yes  Last refill:  last filled by historical provider  Future visit scheduled: yes  Notes to clinic:  Please review for refill. Last filled by historical provider    Requested Prescriptions  Pending Prescriptions Disp Refills   cholecalciferol (VITAMIN D3) 25 MCG (1000 UNIT) tablet      Sig: Take 2 tablets (2,000 Units total) by mouth daily.      Endocrinology:  Vitamins - Vitamin D Supplementation Failed - 11/17/2020  1:48 PM      Failed - 50,000 IU strengths are not delegated      Failed - Phosphate in normal range and within 360 days    No results found for: PHOS        Failed - Vitamin D in normal range and within 360 days    Vit D, 25-Hydroxy  Date Value Ref Range Status  11/16/2020 23.8 (L) 30.0 - 100.0 ng/mL Final    Comment:    Vitamin D deficiency has been defined by the Institute of Medicine and an Endocrine Society practice guideline as a level of serum 25-OH vitamin D less than 20 ng/mL (1,2). The Endocrine Society went on to further define vitamin D insufficiency as a level between 21 and 29 ng/mL (2). 1. IOM (Institute of Medicine). 2010. Dietary reference    intakes for calcium and D. [place]: The    [employer]. 2. Holick MF, Binkley Sturgis, Bischoff-Ferrari HA, et al.    Evaluation, treatment, and prevention of vitamin D    deficiency: an Endocrine Society clinical practice    guideline. JCEM. 2011 Jul; 96(7):1911-30.           Passed - Ca in normal range and within 360 days    Calcium  Date Value Ref Range Status  11/16/2020 8.9 8.7 - 10.3 mg/dL Final          Passed - Valid encounter within last 12 months    Recent Outpatient Visits           1 month ago Essential hypertension   [place], MD   5 months ago Encounter for [employer] annual wellness exam   [place] Ladell Pier, MD   7 months ago Essential hypertension   [place], MD   11 months ago Essential hypertension   [place], Deborah B, MD   1 year ago Essential hypertension   [place], MD       Future Appointments             In 2 months Wynetta Emery Dalbert Batman, MD [place]

## 2020-11-21 ENCOUNTER — Ambulatory Visit (INDEPENDENT_AMBULATORY_CARE_PROVIDER_SITE_OTHER): Payer: Medicare Other | Admitting: Ophthalmology

## 2020-11-21 ENCOUNTER — Other Ambulatory Visit: Payer: Self-pay

## 2020-11-21 ENCOUNTER — Encounter (INDEPENDENT_AMBULATORY_CARE_PROVIDER_SITE_OTHER): Payer: Self-pay | Admitting: Ophthalmology

## 2020-11-21 DIAGNOSIS — H2513 Age-related nuclear cataract, bilateral: Secondary | ICD-10-CM | POA: Diagnosis not present

## 2020-11-21 DIAGNOSIS — H4312 Vitreous hemorrhage, left eye: Secondary | ICD-10-CM

## 2020-11-21 DIAGNOSIS — H33312 Horseshoe tear of retina without detachment, left eye: Secondary | ICD-10-CM | POA: Diagnosis not present

## 2020-11-21 NOTE — Assessment & Plan Note (Signed)

## 2020-11-21 NOTE — Progress Notes (Signed)
11/21/2020     CHIEF COMPLAINT Patient presents for Retina Follow Up   HISTORY OF PRESENT ILLNESS: Ashley Padilla is a 69 y.o. female who presents to the clinic today for:   HPI    Retina Follow Up    Patient presents with  Retinal Break/Detachment.  In both eyes.  This started 6 months ago.  Severity is mild.  Duration of 6 months.  Since onset it is stable.          Comments    6 MO FU OU    Pt reports after getting new glasses in May of this year she couldn't see as well and put on the older glasses to see better, has to blink a lot in the AM to focus, small pus spot LLL, no new f/f, no pain or pressure.       Last edited by Nichola Sizer D on 11/21/2020 10:11 AM. (History)      Referring physician: Ladell Pier, MD [place],  [place] [address]  HISTORICAL INFORMATION:   Selected notes from the MEDICAL RECORD NUMBER       CURRENT MEDICATIONS: No current outpatient medications on file. (Ophthalmic Drugs)   No current facility-administered medications for this visit. (Ophthalmic Drugs)   Current Outpatient Medications (Other)  Medication Sig  . amLODipine (NORVASC) 5 MG tablet TAKE 1 TABLET BY MOUTH EVERY DAY  . cholecalciferol (VITAMIN D3) 25 MCG (1000 UNIT) tablet Take 2 tablets (2,000 Units total) by mouth daily.  . diclofenac sodium (VOLTAREN) 1 % GEL Apply 2 g topically 4 (four) times daily as needed.  Marland Kitchen lisinopril (ZESTRIL) 20 MG tablet TAKE 1 TABLET BY MOUTH EVERY DAY  . Multiple Vitamins-Minerals (ONE-A-DAY VITACRAVES ADULT) CHEW Chew 1 tablet by mouth 2 (two) times a day.  . Omega-3 Fatty Acids (FISH OIL PO) Take 1 capsule by mouth daily with breakfast.  . QUEtiapine (SEROQUEL) 50 MG tablet Take 1 tablet (50 mg total) by mouth at bedtime.  . sertraline (ZOLOFT) 100 MG tablet Take 1.5 tablets (150 mg total) by mouth at bedtime.   No current facility-administered medications for this visit. (Other)      REVIEW OF  SYSTEMS:    ALLERGIES Allergies  Allergen Reactions  . Penicillin V Potassium Other (See Comments)    Reaction happened as a baby- not recalled Did it involve swelling of the face/tongue/throat, SOB, or low BP? Unk Did it involve sudden or severe rash/hives, skin peeling, or any reaction on the inside of your mouth or nose? Unk Did you need to seek medical attention at a hospital or doctor's office? Unk When did it last happen?"I was a baby" If all above answers are "NO", may proceed with cephalosporin use.    Marland Kitchen Penicillins Other (See Comments)    Reaction happened as a baby- not recalled Did it involve swelling of the face/tongue/throat, SOB, or low BP? Unk Did it involve sudden or severe rash/hives, skin peeling, or any reaction on the inside of your mouth or nose? Unk Did you need to seek medical attention at a hospital or doctor's office? Unk When did it last happen?"I was a baby" If all above answers are "NO", may proceed with cephalosporin use.  . Latex   . Adhesive [Tape] Rash    Terrible rash developed at incision site    PAST MEDICAL HISTORY Past Medical History:  Diagnosis Date  . Anxiety   . Depression   . Hypertension 2016  Past Surgical History:  Procedure Laterality Date  . COLON RESECTION N/A 11/2009   for presumed cancerous polyp that turned out not to be CA  . COLON SURGERY  2010   Colon Resection  . COLON SURGERY  2011   Ostomy reversal   . polypectomy uterous  2012  . WRIST FRACTURE SURGERY Bilateral 2018, 03/2019   LT 2018 and RT 2020  . WRIST SURGERY Left     FAMILY HISTORY Family History  Problem Relation Age of Onset  . Anxiety disorder Mother   . Depression Mother   . Alcohol abuse Father   . Anxiety disorder Sister   . Depression Sister   . Breast cancer Sister   . Anxiety disorder Maternal Grandmother   . Depression Maternal Grandmother     SOCIAL HISTORY Social History   Tobacco Use  . Smoking status: Never Smoker  .  Smokeless tobacco: Never Used  Vaping Use  . Vaping Use: Never used  Substance Use Topics  . Alcohol use: Not Currently  . Drug use: Not Currently         OPHTHALMIC EXAM:  Base Eye Exam    Visual Acuity (ETDRS)      Right Left   Dist cc 20/30 -2 20/30 -1   Dist ph cc 20/20 -2 20/25 +2   Correction: Glasses       Tonometry (Tonopen, 10:15 AM)      Right Left   Pressure 19 26  3x Attempts OU       Pupils      Dark Light Shape React APD   Right 6 3 Round Brisk None   Left 6 3 Round Brisk None       Visual Fields (Counting fingers)      Left Right    Full Full       Extraocular Movement      Right Left    Full Full       Neuro/Psych    Oriented x3: Yes   Mood/Affect: Normal       Dilation    Both eyes: 1.0% Mydriacyl, 2.5% Phenylephrine @ 10:16 AM        Slit Lamp and Fundus Exam    External Exam      Right Left   External Normal Normal       Slit Lamp Exam      Right Left   Lids/Lashes Normal Normal   Conjunctiva/Sclera White and quiet White and quiet   Cornea Clear Clear   Anterior Chamber Deep and quiet Deep and quiet   Iris Round and reactive Round and reactive   Lens 2+ Nuclear sclerosis 2+ Nuclear sclerosis   Anterior Vitreous Normal Normal       Fundus Exam      Right Left   Posterior Vitreous Normal Vitreous hemorrhage 1+ old inferior   Disc Normal Normal   C/D Ratio 0.2 0.2   Macula Normal Normal   Vessels Normal Normal   Periphery Normal, no breaks Retinal break, 330,,, good retinopexy, no new breaks seen, poor view inferior due to dense old vitreous hemorrhage,  28D and 20 D lenses near to the ora          IMAGING AND PROCEDURES  Imaging and Procedures for 11/21/20  OCT, Retina - OU - Both Eyes       Right Eye Quality was good. Scan locations included subfoveal. Central Foveal Thickness: 305. Findings include normal foveal contour.   Left Eye  Quality was good. Scan locations included subfoveal. Central Foveal  Thickness: 320. Findings include normal foveal contour.   Notes Minor vitreous debris left eye, no active maculopathy either eye                ASSESSMENT/PLAN:  Nuclear sclerotic cataract of both eyes The nature of cataract was discussed with the patient as well as the elective nature of surgery. The patient was reassured that surgery at a later date does not put the patient at risk for a worse outcome. It was emphasized that the need for surgery is dictated by the patient's quality of life as influenced by the cataract. Patient was instructed to maintain close follow up with their general eye care doctor.  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 Nuclear sclerotic cataract of both eyes  H25.13   3. Vitreous hemorrhage of left eye (HCC)  H43.12     1.  Minor vitreous hemorrhage left eye continues to clear, observe  2.  Retinal break left eye well treated no new breaks  3.  Mild elevation of intraocular pressure noted on incidental examination screening pressure test today, recommend follow-up with Dr. Nathanial Rancher as scheduled  Ophthalmic Meds Ordered this visit:  No orders of the defined types were placed in this encounter.      Return in about 1 year (around 11/21/2021) for DILATE OU, OCT.  There are no Patient Instructions on file for this visit.   Explained the diagnoses, plan, and follow up with the patient and they expressed understanding.  Patient expressed understanding of the importance of proper follow up care.   Clent Demark Welby Montminy M.D. Diseases & Surgery of the Retina and Vitreous [facility] [place] 11/21/20     Abbreviations: M myopia (nearsighted); A astigmatism; H hyperopia  (farsighted); P presbyopia; Mrx spectacle prescription;  CTL contact lenses; OD right eye; OS left eye; OU both eyes  XT exotropia; ET esotropia; PEK punctate epithelial keratitis; PEE punctate epithelial erosions; DES dry eye syndrome; MGD meibomian gland dysfunction; ATs artificial tears; PFAT's preservative free artificial tears; [place] nuclear sclerotic cataract; PSC posterior subcapsular cataract; ERM epi-retinal membrane; PVD posterior vitreous detachment; RD retinal detachment; DM diabetes mellitus; DR diabetic retinopathy; NPDR non-proliferative diabetic retinopathy; PDR proliferative diabetic retinopathy; CSME clinically significant macular edema; DME diabetic macular edema; dbh dot blot hemorrhages; CWS cotton wool spot; POAG primary open angle glaucoma; C/D cup-to-disc ratio; HVF humphrey visual field; GVF goldmann visual field; OCT optical coherence tomography; IOP intraocular pressure; BRVO Branch retinal vein occlusion; CRVO central retinal vein occlusion; CRAO central retinal artery occlusion; BRAO branch retinal artery occlusion; RT retinal tear; SB scleral buckle; PPV pars plana vitrectomy; VH Vitreous hemorrhage; PRP panretinal laser photocoagulation; IVK intravitreal kenalog; VMT vitreomacular traction; MH Macular hole;  NVD neovascularization of the disc; NVE neovascularization elsewhere; AREDS age related eye disease study; ARMD age related macular degeneration; POAG primary open angle glaucoma; EBMD epithelial/anterior basement membrane dystrophy; ACIOL anterior chamber intraocular lens; IOL intraocular lens; PCIOL posterior chamber intraocular lens; Phaco/IOL phacoemulsification with intraocular lens placement; [place] photorefractive keratectomy; LASIK laser assisted in situ keratomileusis; HTN hypertension; DM diabetes mellitus; COPD chronic obstructive pulmonary disease

## 2020-11-21 NOTE — Assessment & Plan Note (Signed)
Clearing vitreous hemorrhage left eye

## 2020-11-21 NOTE — Assessment & Plan Note (Signed)
Well treated at 3:30 position OS, no new breaks

## 2021-01-12 ENCOUNTER — Telehealth (INDEPENDENT_AMBULATORY_CARE_PROVIDER_SITE_OTHER): Payer: Medicare Other | Admitting: Psychiatry

## 2021-01-12 ENCOUNTER — Other Ambulatory Visit: Payer: Self-pay

## 2021-01-12 DIAGNOSIS — F411 Generalized anxiety disorder: Secondary | ICD-10-CM

## 2021-01-12 DIAGNOSIS — F3342 Major depressive disorder, recurrent, in full remission: Secondary | ICD-10-CM

## 2021-01-12 MED ORDER — QUETIAPINE FUMARATE 50 MG PO TABS: 50.0000 mg | ORAL_TABLET | Freq: Every day | ORAL | 1 refills | Status: DC

## 2021-01-12 MED ORDER — SERTRALINE HCL 100 MG PO TABS: 150.0000 mg | ORAL_TABLET | Freq: Every day | ORAL | 1 refills | Status: DC

## 2021-01-12 NOTE — Progress Notes (Signed)
[place] MD/PA/NP OP Progress Note  01/12/2021 9:39 AM Ashley Padilla  MRN:  [id number] Interview was conducted by phone and I verified that I was speaking with the correct person using two identifiers. I discussed the limitations of evaluation and management by telemedicine and  the availability of in person appointments. Patient expressed understanding and agreed to proceed. Participants in the visit: patient (location - home); physician (location - home office).  Chief Complaint: None.  HPI: 70yo widowed female withGAD and major depression.Moodnormalizedafter dose of sertraline was increased to 150 mg but Ashley Padilla initially reportedmore problems with sleep after Seroquel dose was deceased by 25 mg to 50 mg at HS.She now sleeping 7-8 hours uninterrupted with it.No SI, no psychosis. She hasdecided that she will split time between [place] and [place] where some of her family live. Supportive son and daughter-in-law locally.Ashley Padilla does not desire to change hersertralineor quetiapine as her mood and sleep have improved.    Visit Diagnosis:    ICD-10-CM   1. Major depressive disorder, recurrent episode, in full remission ([place])  F33.42   2. GAD (generalized anxiety disorder)  F41.1     Past Psychiatric History: Please see intake H&P.  Past Medical History:  Past Medical History:  Diagnosis Date  . Anxiety   . Depression   . Hypertension 2016    Past Surgical History:  Procedure Laterality Date  . COLON RESECTION N/A 11/2009   for presumed cancerous polyp that turned out not to be CA  . COLON SURGERY  2010   Colon Resection  . COLON SURGERY  2011   Ostomy reversal   . polypectomy uterous  2012  . WRIST FRACTURE SURGERY Bilateral 2018, 03/2019   LT 2018 and RT 2020  . WRIST SURGERY Left     Family Psychiatric History: Reviewed.  Family History:  Family History  Problem Relation Age of Onset  . Anxiety disorder Mother   . Depression Mother   . Alcohol abuse Father   .  Anxiety disorder Sister   . Depression Sister   . Breast cancer Sister   . Anxiety disorder Maternal Grandmother   . Depression Maternal Grandmother     Social History:  Social History   Socioeconomic History  . Marital status: Widowed    Spouse name: Not on file  . Number of children: 3  . Years of education: BS degree  . Highest education level: Not on file  Occupational History  . Occupation: retired Pharmacist, hospital  Tobacco Use  . Smoking status: Never Smoker  . Smokeless tobacco: Never Used  Vaping Use  . Vaping Use: Never used  Substance and Sexual Activity  . Alcohol use: Not Currently  . Drug use: Not Currently  . Sexual activity: Not Currently  Other Topics Concern  . Not on file  Social History Narrative  . Not on file   Social Determinants of Health   Financial Resource Strain: Not on file  Food Insecurity: Not on file  Transportation Needs: Not on file  Physical Activity: Not on file  Stress: Not on file  Social Connections: Not on file    Allergies:  Allergies  Allergen Reactions  . Penicillin V Potassium Other (See Comments)    Reaction happened as a baby- not recalled Did it involve swelling of the face/tongue/throat, SOB, or low BP? Unk Did it involve sudden or severe rash/hives, skin peeling, or any reaction on the inside of your mouth or nose? Unk Did you need to seek medical attention at  a hospital or doctor's office? Unk When did it last happen?"I was a baby" If all above answers are "NO", may proceed with cephalosporin use.    Marland Kitchen Penicillins Other (See Comments)    Reaction happened as a baby- not recalled Did it involve swelling of the face/tongue/throat, SOB, or low BP? Unk Did it involve sudden or severe rash/hives, skin peeling, or any reaction on the inside of your mouth or nose? Unk Did you need to seek medical attention at a hospital or doctor's office? Unk When did it last happen?"I was a baby" If all above answers are "NO", may  proceed with cephalosporin use.  . Latex   . Adhesive [Tape] Rash    Terrible rash developed at incision site    Metabolic Disorder Labs: No results found for: HGBA1C, MPG No results found for: PROLACTIN Lab Results  Component Value Date   CHOL 270 (H) 11/16/2020   TRIG 132 11/16/2020   HDL 63 11/16/2020   CHOLHDL 4.3 11/16/2020   LDLCALC 183 (H) 11/16/2020   LDLCALC 134 (H) 06/24/2019   No results found for: TSH  Therapeutic Level Labs: No results found for: LITHIUM No results found for: VALPROATE No components found for:  CBMZ  Current Medications: Current Outpatient Medications  Medication Sig Dispense Refill  . amLODipine (NORVASC) 5 MG tablet TAKE 1 TABLET BY MOUTH EVERY DAY 90 tablet 0  . cholecalciferol (VITAMIN D3) 25 MCG (1000 UNIT) tablet Take 2 tablets (2,000 Units total) by mouth daily. 100 tablet 1  . diclofenac sodium (VOLTAREN) 1 % GEL Apply 2 g topically 4 (four) times daily as needed. 100 g 1  . lisinopril (ZESTRIL) 20 MG tablet TAKE 1 TABLET BY MOUTH EVERY DAY 90 tablet 0  . Multiple Vitamins-Minerals (ONE-A-DAY VITACRAVES ADULT) CHEW Chew 1 tablet by mouth 2 (two) times a day.    . Omega-3 Fatty Acids (FISH OIL PO) Take 1 capsule by mouth daily with breakfast.    . QUEtiapine (SEROQUEL) 50 MG tablet Take 1 tablet (50 mg total) by mouth at bedtime. 90 tablet 1  . [START ON 03/13/2021] sertraline (ZOLOFT) 100 MG tablet Take 1.5 tablets (150 mg total) by mouth at bedtime. 135 tablet 1   No current facility-administered medications for this visit.      Psychiatric Specialty Exam: Review of Systems  All other systems reviewed and are negative.   There were no vitals taken for this visit.There is no height or weight on file to calculate BMI.  General Appearance: NA  Eye Contact:  NA  Speech:  Clear and Coherent and Normal Rate  Volume:  Normal  Mood:  Euthymic  Affect:  NA  Thought Process:  Goal Directed and Linear  Orientation:  Full (Time, Place,  and Person)  Thought Content: Logical   Suicidal Thoughts:  No  Homicidal Thoughts:  No  Memory:  Immediate;   Good Recent;   Good Remote;   Good  Judgement:  Good  Insight:  Good  Psychomotor Activity:  NA  Concentration:  Concentration: Good  Recall:  Good  Fund of Knowledge: Good  Language: Good  Akathisia:  Negative  Handed:  Right  AIMS (if indicated): not done  Assets:  Communication Skills Desire for Improvement Financial Resources/Insurance Housing Social Support Transportation  ADL's:  Intact  Cognition: WNL  Sleep:  Good   Screenings: GAD-7   Flowsheet Row Telemedicine from 10/03/2020 in [facility] Office Visit from [facility]/14/2020 in [facility] Office Visit from 09/07/2019 in [facility] Office Visit from 05/29/2019 in [facility] Counselor from 11/21/2018 in [facility]  Total GAD-7 Score 0 2 4 4 6     [place] Office Visit from 06/03/2020 in [place] [facility]  Total Score (max 30 points ) 30    PHQ2-9   Flowsheet Row Telemedicine from 10/03/2020 in [place] Office Visit from 06/03/2020 in [place] Office Visit from 04/14/2020 in [place] Office Visit from 09/07/2019 in [place] Office Visit from 05/29/2019 in [facility]  PHQ-2 Total Score 0 1 1 2 2   PHQ-9 Total Score - - - 3 5       Assessment and Plan: 70yo widowed female withGAD and major depression.Moodnormalizedafter dose of sertraline was increased to 150 mg but Ashley Padilla initially reportedmore problems with sleep after Seroquel dose was deceased by 25 mg to 50 mg at HS.She now sleeping 7-8 hours uninterrupted with it.No SI, no psychosis. She hasdecided  that she will split time between [place] and [place] where some of her family live. Supportive son and daughter-in-law locally.Ashley Padilla does not desire to change hersertralineor quetiapine as her mood and sleep have improved.   Dx: GAD in remission; MDD recurrent in remission  Plan: Continue sertraline 150 mgin PM, quetiapine 50 mg at HS. Next appointment in42months or prn with a new provider.The plan was discussed with patient who had an opportunity to ask questions and these were all answered. I spend15 minutes inphone consultation with the patient.   Stephanie Acre, MD 01/12/2021, 9:39 AM

## 2021-01-18 ENCOUNTER — Other Ambulatory Visit: Payer: Self-pay | Admitting: Internal Medicine

## 2021-01-18 DIAGNOSIS — I10 Essential (primary) hypertension: Secondary | ICD-10-CM

## 2021-02-06 ENCOUNTER — Ambulatory Visit: Payer: Medicare Other | Attending: Internal Medicine | Admitting: Internal Medicine

## 2021-02-06 ENCOUNTER — Other Ambulatory Visit: Payer: Self-pay

## 2021-02-06 ENCOUNTER — Other Ambulatory Visit: Payer: Self-pay | Admitting: Internal Medicine

## 2021-02-06 VITALS — BP 121/71 | HR 73 | Ht 70.0 in | Wt 196.8 lb

## 2021-02-06 DIAGNOSIS — E663 Overweight: Secondary | ICD-10-CM

## 2021-02-06 DIAGNOSIS — I10 Essential (primary) hypertension: Secondary | ICD-10-CM

## 2021-02-06 DIAGNOSIS — E569 Vitamin deficiency, unspecified: Secondary | ICD-10-CM

## 2021-02-06 DIAGNOSIS — E782 Mixed hyperlipidemia: Secondary | ICD-10-CM

## 2021-02-06 NOTE — Patient Instructions (Signed)
I recommend that you try to get a disc of your mammogram images to take with you to [place] so that the radiologist would have that to compare when you do get your mammogram there.  For the vitamin D deficiency, you should take vitamin D 2000 IU + 400 IU daily.   High Cholesterol  High cholesterol is a condition in which the blood has high levels of a white, waxy substance similar to fat (cholesterol). The liver makes all the cholesterol that the body needs. The human body needs small amounts of cholesterol to help build cells. A person gets extra or excess cholesterol from the food that he or she eats. The blood carries cholesterol from the liver to the rest of the body. If you have high cholesterol, deposits (plaques) may build up on the walls of your arteries. Arteries are the blood vessels that carry blood away from your heart. These plaques make the arteries narrow and stiff. Cholesterol plaques increase your risk for heart attack and stroke. Work with your health care provider to keep your cholesterol levels in a healthy range. What increases the risk? The following factors may make you more likely to develop this condition:  Eating foods that are high in animal fat (saturated fat) or cholesterol.  Being overweight.  Not getting enough exercise.  A family history of high cholesterol (familial hypercholesterolemia).  Use of tobacco products.  Having diabetes. What are the signs or symptoms? There are no symptoms of this condition. How is this diagnosed? This condition may be diagnosed based on the results of a blood test.  If you are older than 69 years of age, your health care provider may check your cholesterol levels every 4-6 years.  You may be checked more often if you have high cholesterol or other risk factors for heart disease. The blood test for cholesterol measures:  "Bad" cholesterol, or LDL cholesterol. This is the main type of cholesterol that causes heart disease.  The desired level is less than 100 mg/dL.  "Good" cholesterol, or HDL cholesterol. HDL helps protect against heart disease by cleaning the arteries and carrying the LDL to the liver for processing. The desired level for HDL is 60 mg/dL or higher.  Triglycerides. These are fats that your body can store or burn for energy. The desired level is less than 150 mg/dL.  Total cholesterol. This measures the total amount of cholesterol in your blood and includes LDL, HDL, and triglycerides. The desired level is less than 200 mg/dL. How is this treated? This condition may be treated with:  Diet changes. You may be asked to eat foods that have more fiber and less saturated fats or added sugar.  Lifestyle changes. These may include regular exercise, maintaining a healthy weight, and quitting use of tobacco products.  Medicines. These are given when diet and lifestyle changes have not worked. You may be prescribed a statin medicine to help lower your cholesterol levels. Follow these instructions at home: Eating and drinking  Eat a healthy, balanced diet. This diet includes: ? Daily servings of a variety of fresh, frozen, or canned fruits and vegetables. ? Daily servings of whole grain foods that are rich in fiber. ? Foods that are low in saturated fats and trans fats. These include poultry and fish without skin, lean cuts of meat, and low-fat dairy products. ? A variety of fish, especially oily fish that contain omega-3 fatty acids. Aim to eat fish at least 2 times a week.  Avoid foods  and drinks that have added sugar.  Use healthy cooking methods, such as roasting, grilling, broiling, baking, poaching, steaming, and stir-frying. Do not fry your food except for stir-frying.   Lifestyle  Get regular exercise. Aim to exercise for a total of 150 minutes a week. Increase your activity level by doing activities such as gardening, walking, and taking the stairs.  Do not use any products that contain  nicotine or tobacco, such as cigarettes, e-cigarettes, and chewing tobacco. If you need help quitting, ask your health care provider.   General instructions  Take over-the-counter and prescription medicines only as told by your health care provider.  Keep all follow-up visits as told by your health care provider. This is important. Where to find more information  American Heart Association: www.heart.org  National Heart, Lung, and Blood Institute: https://wilson-eaton.com/ Contact a health care provider if:  You have trouble achieving or maintaining a healthy diet or weight.  You are starting an exercise program.  You are unable to stop smoking. Get help right away if:  You have chest pain.  You have trouble breathing.  You have any symptoms of a stroke. "BE FAST" is an easy way to remember the main warning signs of a stroke: ? B - Balance. Signs are dizziness, sudden trouble walking, or loss of balance. ? E - Eyes. Signs are trouble seeing or a sudden change in vision. ? F - Face. Signs are sudden weakness or numbness of the face, or the face or eyelid drooping on one side. ? A - Arms. Signs are weakness or numbness in an arm. This happens suddenly and usually on one side of the body. ? S - Speech. Signs are sudden trouble speaking, slurred speech, or trouble understanding what people say. ? T - Time. Time to call emergency services. Write down what time symptoms started.  You have other signs of a stroke, such as: ? A sudden, severe headache with no known cause. ? Nausea or vomiting. ? Seizure. These symptoms may represent a serious problem that is an emergency. Do not wait to see if the symptoms will go away. Get medical help right away. Call your local emergency services (911 in the [place] Do not drive yourself to the hospital. Summary  Cholesterol plaques increase your risk for heart attack and stroke. Work with your health care provider to keep your cholesterol levels in a  healthy range.  Eat a healthy, balanced diet, get regular exercise, and maintain a healthy weight.  Do not use any products that contain nicotine or tobacco, such as cigarettes, e-cigarettes, and chewing tobacco.  Get help right away if you have any symptoms of a stroke. This information is not intended to replace advice given to you by your health care provider. Make sure you discuss any questions you have with your health care provider. Document Revised: 10/26/2019 Document Reviewed: 10/26/2019 Elsevier Patient Education  2021 [employer].

## 2021-02-06 NOTE — Progress Notes (Signed)
Patient ID: Ashley Padilla, female    DOB: [birth year]  MRN: [id number]  CC: Hypertension   Subjective: Ashley Padilla is a 70 y.o. female who presents for chronic ds management Her concerns today include:  Pt with hx ofGAD/MDD followed by psychiatry, HTN, HL,hx of partial colon resection, vit D def   Vit D def:  She did receive my Mychart message.  I recommended taking an additional 400 IU with the 2000 IU daily.  She ahs been taking just the 2000 IU  Mixed HL: Her LDL cholesterol was 183 and total cholesterol was 270.  She did receive my message that I sent via MyChart about possibly starting her on low-dose of statin therapy and referring to nutritionist.  However she did not respond to the message.  Patient states that she will be moving to [place] next month.  She would like to hold off on starting statin therapy but would be interested in referral to nutritionist.  She will plan to see a nutritionist once she has settled in [place].  HTN:  Reports compliance with Norvasc and Lisinopril.  Reports Norvasc cost increased and if it has she may request change to different med.  Not checking BP.   No CP/SOB/HA/dizziness Still hiking 3 x a wk and does line dance classes.  Tries to avoid sweets.   Pt due for MMG. Had bx of RT breast 12/2019.  She plans to wait until she gets to [place] to have it done.   Patient Active Problem List   Diagnosis Date Noted  . Horseshoe tear of retina of left eye without detachment 04/13/2020  . Vitreous hemorrhage of left eye ([place]) 04/13/2020  . Nuclear sclerotic cataract of both eyes 04/13/2020  . Screening mammogram, encounter for 10/19/2019  . Adenomatous polyp of colon 09/07/2019  . Post-menopausal bleeding 09/07/2019  . Hyperlipidemia, mixed 06/25/2019  . Vitamin D deficiency 06/25/2019  . Wrist pain, chronic, right 05/29/2019  . Acute pain of right shoulder 05/29/2019  . Essential hypertension 05/29/2019  . History of wrist fracture 05/29/2019   . Major depressive disorder, recurrent episode, in full remission ([place]) 01/15/2019  . GAD (generalized anxiety disorder) 01/15/2019     Current Outpatient Medications on File Prior to Visit  Medication Sig Dispense Refill  . cholecalciferol (VITAMIN D3) 25 MCG (1000 UNIT) tablet Take 2 tablets (2,000 Units total) by mouth daily. 100 tablet 1  . diclofenac sodium (VOLTAREN) 1 % GEL Apply 2 g topically 4 (four) times daily as needed. 100 g 1  . lisinopril (ZESTRIL) 20 MG tablet TAKE 1 TABLET BY MOUTH EVERY DAY 90 tablet 0  . Multiple Vitamins-Minerals (ONE-A-DAY VITACRAVES ADULT) CHEW Chew 1 tablet by mouth 2 (two) times a day.    . Omega-3 Fatty Acids (FISH OIL PO) Take 1 capsule by mouth daily with breakfast.    . QUEtiapine (SEROQUEL) 50 MG tablet Take 1 tablet (50 mg total) by mouth at bedtime. 90 tablet 1  . [START ON 03/13/2021] sertraline (ZOLOFT) 100 MG tablet Take 1.5 tablets (150 mg total) by mouth at bedtime. 135 tablet 1   No current facility-administered medications on file prior to visit.    Allergies  Allergen Reactions  . Penicillin V Potassium Other (See Comments)    Reaction happened as a baby- not recalled Did it involve swelling of the face/tongue/throat, SOB, or low BP? Unk Did it involve sudden or severe rash/hives, skin peeling, or any reaction on the inside of your mouth or nose?  Unk Did you need to seek medical attention at a hospital or doctor's office? Unk When did it last happen?"I was a baby" If all above answers are "NO", may proceed with cephalosporin use.    Marland Kitchen Penicillins Other (See Comments)    Reaction happened as a baby- not recalled Did it involve swelling of the face/tongue/throat, SOB, or low BP? Unk Did it involve sudden or severe rash/hives, skin peeling, or any reaction on the inside of your mouth or nose? Unk Did you need to seek medical attention at a hospital or doctor's office? Unk When did it last happen?"I was a baby" If all above  answers are "NO", may proceed with cephalosporin use.  . Latex   . Adhesive [Tape] Rash    Terrible rash developed at incision site    Social History   Socioeconomic History  . Marital status: Widowed    Spouse name: Not on file  . Number of children: 3  . Years of education: BS degree  . Highest education level: Not on file  Occupational History  . Occupation: retired Pharmacist, hospital  Tobacco Use  . Smoking status: Never Smoker  . Smokeless tobacco: Never Used  Vaping Use  . Vaping Use: Never used  Substance and Sexual Activity  . Alcohol use: Not Currently  . Drug use: Not Currently  . Sexual activity: Not Currently  Other Topics Concern  . Not on file  Social History Narrative  . Not on file   Social Determinants of Health   Financial Resource Strain: Not on file  Food Insecurity: Not on file  Transportation Needs: Not on file  Physical Activity: Not on file  Stress: Not on file  Social Connections: Not on file  Intimate Partner Violence: Not on file    Family History  Problem Relation Age of Onset  . Anxiety disorder Mother   . Depression Mother   . Alcohol abuse Father   . Anxiety disorder Sister   . Depression Sister   . Breast cancer Sister   . Anxiety disorder Maternal Grandmother   . Depression Maternal Grandmother     Past Surgical History:  Procedure Laterality Date  . COLON RESECTION N/A 11/2009   for presumed cancerous polyp that turned out not to be CA  . COLON SURGERY  2010   Colon Resection  . COLON SURGERY  2011   Ostomy reversal   . polypectomy uterous  2012  . WRIST FRACTURE SURGERY Bilateral 2018, 03/2019   LT 2018 and RT 2020  . WRIST SURGERY Left     ROS: Review of Systems Negative except as stated above  PHYSICAL EXAM: BP 121/71   Pulse 73   Ht 5\' 10"  (1.778 m)   Wt 196 lb 12.8 oz (89.3 kg)   SpO2 98%   BMI 28.24 kg/m   Wt Readings from Last 3 Encounters:  02/06/21 196 lb 12.8 oz (89.3 kg)  06/03/20 193 lb 6.4 oz (87.7 kg)   04/14/20 194 lb (88 kg)    Physical Exam  General appearance - alert, well appearing, and in no distress Mental status - alert, oriented to person, place, and time Neck - supple, no significant adenopathy Chest - clear to auscultation, no wheezes, rales or rhonchi, symmetric air entry Heart - normal rate, regular rhythm, normal S1, S2, no murmurs, rubs, clicks or gallops Extremities - peripheral pulses normal, no pedal edema, no clubbing or cyanosis   CMP Latest Ref Rng & Units 11/16/2020 06/24/2019  Glucose 65 -  99 mg/dL 93 92  BUN 8 - 27 mg/dL 14 13  Creatinine 0.57 - 1.00 mg/dL 0.89 0.89  Sodium 134 - 144 mmol/L 143 145(H)  Potassium 3.5 - 5.2 mmol/L 3.7 4.0  Chloride 96 - 106 mmol/L 103 105  CO2 20 - 29 mmol/L 25 23  Calcium 8.7 - 10.3 mg/dL 8.9 9.2  Total Protein 6.0 - 8.5 g/dL 6.6 6.6  Total Bilirubin 0.0 - 1.2 mg/dL 0.4 0.6  Alkaline Phos 44 - 121 IU/L 61 70  AST 0 - 40 IU/L 17 15  ALT 0 - 32 IU/L 14 20   Lipid Panel     Component Value Date/Time   CHOL 270 (H) 11/16/2020 1035   TRIG 132 11/16/2020 1035   HDL 63 11/16/2020 1035   CHOLHDL 4.3 11/16/2020 1035   LDLCALC 183 (H) 11/16/2020 1035    CBC    Component Value Date/Time   WBC 4.9 11/16/2020 1035   RBC 4.52 11/16/2020 1035   HGB 12.9 11/16/2020 1035   HCT 39.4 11/16/2020 1035   PLT 149 (L) 11/16/2020 1035   MCV 87 11/16/2020 1035   MCH 28.5 11/16/2020 1035   MCHC 32.7 11/16/2020 1035   RDW 13.8 11/16/2020 1035    ASSESSMENT AND PLAN: 1. Essential hypertension At goal. Continue Norvasc and Lisinopril  2. Vitamin deficiency Pt advise to purchase Vit D 400 IU and take with 2000 IU daily  3. Mixed hyperlipidemia Pt wants to hold off on starting statin.  She plans to see nutritionist once she relocates to [place]  4. Over weight Continue regular exercise and healthy eating habits  Patient was given the opportunity to ask questions.  Patient verbalized understanding of the plan and was able to  repeat key elements of the plan.   No orders of the defined types were placed in this encounter.    Requested Prescriptions    No prescriptions requested or ordered in this encounter    No follow-ups on file.  Karle Plumber, MD, FACP

## 2021-04-15 ENCOUNTER — Other Ambulatory Visit: Payer: Self-pay | Admitting: Internal Medicine

## 2021-04-15 DIAGNOSIS — I10 Essential (primary) hypertension: Secondary | ICD-10-CM

## 2021-04-15 NOTE — Telephone Encounter (Signed)
Requested Prescriptions  Pending Prescriptions Disp Refills  . lisinopril (ZESTRIL) 20 MG tablet [Pharmacy Med Name: LISINOPRIL 20 MG TABLET] 90 tablet 0    Sig: TAKE 1 TABLET BY MOUTH EVERY DAY     Cardiovascular:  ACE Inhibitors Passed - 04/15/2021 10:06 AM      Passed - Cr in normal range and within 180 days    Creatinine, Ser  Date Value Ref Range Status  11/16/2020 0.89 0.57 - 1.00 mg/dL Final         Passed - K in normal range and within 180 days    Potassium  Date Value Ref Range Status  11/16/2020 3.7 3.5 - 5.2 mmol/L Final         Passed - Patient is not pregnant      Passed - Last BP in normal range    BP Readings from Last 1 Encounters:  02/06/21 121/71         Passed - Valid encounter within last 6 months    Recent Outpatient Visits          2 months ago Essential hypertension   [place] Ladell Pier, MD   6 months ago Essential hypertension   [place] Ladell Pier, MD   10 months ago Encounter for [employer] annual wellness exam   [place] Ladell Pier, MD   1 year ago Essential hypertension   [place] Ladell Pier, MD   1 year ago Essential hypertension   [place] Ladell Pier, MD

## 2021-05-04 ENCOUNTER — Telehealth: Payer: Self-pay | Admitting: Internal Medicine

## 2021-05-04 ENCOUNTER — Other Ambulatory Visit: Payer: Self-pay | Admitting: Internal Medicine

## 2021-05-04 DIAGNOSIS — I10 Essential (primary) hypertension: Secondary | ICD-10-CM

## 2021-05-04 MED ORDER — AMLODIPINE BESYLATE 5 MG PO TABS: 1.0000 | ORAL_TABLET | Freq: Every day | ORAL | 0 refills | Status: DC

## 2021-05-04 NOTE — Telephone Encounter (Signed)
  Greggory Keen D   TP  05/04/21 11:39 AM Note Pt called saying she has moved to [place] and needs a refill on the Amlodipine .  She has a new doctor that she is seeing July 19th but until then she will need this refill  [facility] [place] [facility] [place].  CB#  [phone]

## 2021-05-04 NOTE — Telephone Encounter (Signed)
Medication Refill - Medication: amLODipine (NORVASC) 5 MG tablet    Preferred Pharmacy (with phone number or street name): [id number] [place], [place]: Please be advised that RX refills may take up to 3 business days. We ask that you follow-up with your pharmacy.

## 2021-05-04 NOTE — Telephone Encounter (Signed)
Will send in refill, already requested in refill encounter.

## 2021-05-04 NOTE — Telephone Encounter (Signed)
Pt called saying she has moved to [place] and needs a refill on the Amlodipine .  She has a new doctor that she is seeing July 19th but until then she will need this refill  [facility] [place] [facility] [place].  CB#  [phone]

## 2021-05-06 ENCOUNTER — Other Ambulatory Visit: Payer: Self-pay | Admitting: Internal Medicine

## 2021-05-06 DIAGNOSIS — I10 Essential (primary) hypertension: Secondary | ICD-10-CM

## 2021-05-06 NOTE — Telephone Encounter (Signed)
Change of pharmacy Requested Prescriptions  Pending Prescriptions Disp Refills  . amLODipine (NORVASC) 5 MG tablet [Pharmacy Med Name: AMLODIPINE BESYLATE 5 MG TAB] 90 tablet 0    Sig: TAKE 1 TABLET BY MOUTH EVERY DAY     Cardiovascular:  Calcium Channel Blockers Passed - 05/06/2021  9:48 AM      Passed - Last BP in normal range    BP Readings from Last 1 Encounters:  02/06/21 121/71         Passed - Valid encounter within last 6 months    Recent Outpatient Visits          2 months ago Essential hypertension   [place] Ladell Pier, MD   7 months ago Essential hypertension   [place] Ladell Pier, MD   11 months ago Encounter for [employer] annual wellness exam   [place] Ladell Pier, MD   1 year ago Essential hypertension   [place] Ladell Pier, MD   1 year ago Essential hypertension   [place] Ladell Pier, MD

## 2021-06-26 ENCOUNTER — Other Ambulatory Visit: Payer: Self-pay | Admitting: Internal Medicine

## 2021-06-26 DIAGNOSIS — I10 Essential (primary) hypertension: Secondary | ICD-10-CM

## 2021-06-26 NOTE — Telephone Encounter (Signed)
  Notes to clinic:  Requesting a 90 day supply    Requested Prescriptions  Pending Prescriptions Disp Refills   amLODipine (NORVASC) 5 MG tablet [Pharmacy Med Name: AMLODIPINE BESYLATE 5 MG TAB] 90 tablet     Sig: TAKE 1 TABLET BY MOUTH EVERY DAY      Cardiovascular:  Calcium Channel Blockers Passed - 06/26/2021  1:42 PM      Passed - Last BP in normal range    BP Readings from Last 1 Encounters:  02/06/21 121/71          Passed - Valid encounter within last 6 months    Recent Outpatient Visits           4 months ago Essential hypertension   [place] Ladell Pier, MD   8 months ago Essential hypertension   [facility], MD   1 year ago Encounter for [employer] annual wellness exam   [place] Ladell Pier, MD   1 year ago Essential hypertension   [place] Ladell Pier, MD   1 year ago Essential hypertension   [place] Ladell Pier, MD

## 2021-07-13 ENCOUNTER — Telehealth (HOSPITAL_COMMUNITY): Payer: Self-pay

## 2021-07-13 NOTE — Telephone Encounter (Signed)
FORMER PATIENT OF DR. PUCILOWSKI'S. WRITER SPOKE WITH THE PHARMACY REGARDING PATIENT'S QUETIAPINE DUE TO RECEIVING A REFILL REQUEST.Marland Kitchen WRITER THEN CALLED PATIENT AND SHE STATED THAT EVEN THOUGH SHE DIDN'T RECEIVE A LETTER, SHE KNEW PROVIDER HAD LEFT AND SHE HAS A NEW PROVIDER WHO IS TAKING OVER HER MEDICATIONS

## 2021-08-13 ENCOUNTER — Telehealth: Payer: Self-pay

## 2021-08-13 NOTE — Telephone Encounter (Signed)
Called to schedule AWV, patient has moved to [place] and est care with other PCP.

## 2021-09-13 ENCOUNTER — Other Ambulatory Visit: Payer: Self-pay | Admitting: Internal Medicine

## 2021-09-13 DIAGNOSIS — I10 Essential (primary) hypertension: Secondary | ICD-10-CM

## 2021-09-13 NOTE — Telephone Encounter (Signed)
Call to patient- [place] of [place]- advised patient to contact current PCP for refill. Patient states she will contact pharmacy and let them know where to send RF.

## 2021-09-22 ENCOUNTER — Other Ambulatory Visit: Payer: Self-pay | Admitting: Internal Medicine

## 2021-09-22 DIAGNOSIS — I10 Essential (primary) hypertension: Secondary | ICD-10-CM

## 2021-09-22 NOTE — Telephone Encounter (Signed)
Per office note- patient has moved out of state. Call to patient- left message to contact pharmacy to let them know where to forward her Rx refill request. Requested Prescriptions  Pending Prescriptions Disp Refills  . lisinopril (ZESTRIL) 20 MG tablet [Pharmacy Med Name: LISINOPRIL 20 MG TABLET] 90 tablet 0    Sig: TAKE 1 TABLET BY MOUTH EVERY DAY     Cardiovascular:  ACE Inhibitors Failed - 09/22/2021  1:30 AM      Failed - Cr in normal range and within 180 days    Creatinine, Ser  Date Value Ref Range Status  11/16/2020 0.89 0.57 - 1.00 mg/dL Final         Failed - K in normal range and within 180 days    Potassium  Date Value Ref Range Status  11/16/2020 3.7 3.5 - 5.2 mmol/L Final         Failed - Valid encounter within last 6 months    Recent Outpatient Visits          7 months ago Essential hypertension   [place] Ladell Pier, MD   11 months ago Essential hypertension   [place] Ladell Pier, MD   1 year ago Encounter for Medicare annual wellness exam   [place] Ladell Pier, MD   1 year ago Essential hypertension   [place] Ladell Pier, MD   1 year ago Essential hypertension   [place], MD             Passed - Patient is not pregnant      Passed - Last BP in normal range    BP Readings from Last 1 Encounters:  02/06/21 121/71

## 2021-11-19 ENCOUNTER — Other Ambulatory Visit: Payer: Self-pay | Admitting: Internal Medicine

## 2021-11-19 NOTE — Telephone Encounter (Signed)
Requested medication (s) are due for refill today: yes  Requested medication (s) are on the active medication list: yes  Last refill:  11/17/20  Future visit scheduled: no  Notes to clinic:  med not delegated to NT to RF- overdue lab work   Requested Prescriptions  Pending Prescriptions Disp Refills   cholecalciferol (VITAMIN D) 25 MCG (1000 UNIT) tablet [Pharmacy Med Name: VITAMIN D3 25 MCG TABLET] 100 tablet 1    Sig: TAKE 2 TABLETS BY MOUTH DAILY.     Endocrinology:  Vitamins - Vitamin D Supplementation Failed - 11/19/2021 12:30 PM      Failed - 50,000 IU strengths are not delegated      Failed - Ca in normal range and within 360 days    Calcium  Date Value Ref Range Status  11/16/2020 8.9 8.7 - 10.3 mg/dL Final          Failed - Phosphate in normal range and within 360 days    No results found for: PHOS        Failed - Vitamin D in normal range and within 360 days    Vit D, 25-Hydroxy  Date Value Ref Range Status  11/16/2020 23.8 (L) 30.0 - 100.0 ng/mL Final    Comment:    Vitamin D deficiency has been defined by the Institute of Medicine and an Endocrine Society practice guideline as a level of serum 25-OH vitamin D less than 20 ng/mL (1,2). The Endocrine Society went on to further define vitamin D insufficiency as a level between 21 and 29 ng/mL (2). 1. IOM (Institute of Medicine). 2010. Dietary reference    intakes for calcium and D. [place]: The    [employer]. 2. Holick MF, Binkley Moodus, Bischoff-Ferrari HA, et al.    Evaluation, treatment, and prevention of vitamin D    deficiency: an Endocrine Society clinical practice    guideline. JCEM. 2011 Jul; 96(7):1911-30.           Passed - Valid encounter within last 12 months    Recent Outpatient Visits           9 months ago Essential hypertension   [place] Ladell Pier, MD   1 year ago Essential hypertension   [place], Deborah B, MD   1 year ago Encounter for [employer] annual wellness exam   [place] Ladell Pier, MD   1 year ago Essential hypertension   [place] Ladell Pier, MD   1 year ago Essential hypertension   [place] Ladell Pier, MD

## 2021-11-21 ENCOUNTER — Encounter (INDEPENDENT_AMBULATORY_CARE_PROVIDER_SITE_OTHER): Payer: Medicare Other | Admitting: Ophthalmology

## 2021-11-23 ENCOUNTER — Other Ambulatory Visit: Payer: Self-pay | Admitting: Internal Medicine

## 2021-11-23 DIAGNOSIS — I10 Essential (primary) hypertension: Secondary | ICD-10-CM

## 2021-11-23 NOTE — Telephone Encounter (Signed)
Last labs done 11/16/20 Last rx sent in 05/04/21

## 2021-11-24 NOTE — Telephone Encounter (Signed)
Pt states the pharmacy wasn't suppose to send her any request. Pt states she already has her new pcp

## 2021-12-15 ENCOUNTER — Other Ambulatory Visit: Payer: Self-pay | Admitting: Internal Medicine

## 2021-12-15 DIAGNOSIS — I10 Essential (primary) hypertension: Secondary | ICD-10-CM

## 2021-12-15 NOTE — Telephone Encounter (Signed)
Requesting by interface surescripts. Contact patient and patient no longer under PCP care. Requesting to stop all medication refills.  Requested Prescriptions  Refused Prescriptions Disp Refills   lisinopril (ZESTRIL) 20 MG tablet [Pharmacy Med Name: LISINOPRIL 20 MG TABLET] 20 tablet 0    Sig: TAKE 1 TABLET BY MOUTH EVERY DAY     Cardiovascular:  ACE Inhibitors Failed - 12/15/2021  1:44 AM      Failed - Cr in normal range and within 180 days    Creatinine, Ser  Date Value Ref Range Status  11/16/2020 0.89 0.57 - 1.00 mg/dL Final         Failed - K in normal range and within 180 days    Potassium  Date Value Ref Range Status  11/16/2020 3.7 3.5 - 5.2 mmol/L Final         Failed - Valid encounter within last 6 months    Recent Outpatient Visits          10 months ago Essential hypertension   [place] Ladell Pier, MD   1 year ago Essential hypertension   [place] Ladell Pier, MD   1 year ago Encounter for Medicare annual wellness exam   [place] Ladell Pier, MD   1 year ago Essential hypertension   [place] Ladell Pier, MD   2 years ago Essential hypertension   [place], MD             Passed - Patient is not pregnant      Passed - Last BP in normal range    BP Readings from Last 1 Encounters:  02/06/21 121/71

## 2022-01-03 LAB — HM DEXA SCAN

## 2023-06-06 LAB — FECAL OCCULT BLOOD, IMMUNOCHEMICAL: IFOBT: NEGATIVE

## 2024-01-17 ENCOUNTER — Ambulatory Visit: Payer: Medicare Other | Attending: Internal Medicine | Admitting: Internal Medicine

## 2024-01-17 ENCOUNTER — Encounter: Payer: Self-pay | Admitting: Internal Medicine

## 2024-01-17 VITALS — BP 130/81 | HR 66 | Temp 98.0°F | Ht 70.0 in | Wt 191.0 lb

## 2024-01-17 DIAGNOSIS — Z780 Asymptomatic menopausal state: Secondary | ICD-10-CM

## 2024-01-17 DIAGNOSIS — Z7689 Persons encountering health services in other specified circumstances: Secondary | ICD-10-CM

## 2024-01-17 DIAGNOSIS — Z1231 Encounter for screening mammogram for malignant neoplasm of breast: Secondary | ICD-10-CM

## 2024-01-17 DIAGNOSIS — I10 Essential (primary) hypertension: Secondary | ICD-10-CM | POA: Diagnosis not present

## 2024-01-17 DIAGNOSIS — Z23 Encounter for immunization: Secondary | ICD-10-CM | POA: Diagnosis not present

## 2024-01-17 DIAGNOSIS — M8580 Other specified disorders of bone density and structure, unspecified site: Secondary | ICD-10-CM

## 2024-01-17 DIAGNOSIS — F32A Depression, unspecified: Secondary | ICD-10-CM

## 2024-01-17 DIAGNOSIS — E559 Vitamin D deficiency, unspecified: Secondary | ICD-10-CM

## 2024-01-17 DIAGNOSIS — F411 Generalized anxiety disorder: Secondary | ICD-10-CM

## 2024-01-17 DIAGNOSIS — R7303 Prediabetes: Secondary | ICD-10-CM | POA: Diagnosis not present

## 2024-01-17 DIAGNOSIS — E782 Mixed hyperlipidemia: Secondary | ICD-10-CM | POA: Diagnosis not present

## 2024-01-17 MED ORDER — AMLODIPINE BESYLATE 5 MG PO TABS: 5.0000 mg | ORAL_TABLET | Freq: Every day | ORAL | 1 refills | Status: DC

## 2024-01-17 MED ORDER — QUETIAPINE FUMARATE 50 MG PO TABS: 50.0000 mg | ORAL_TABLET | Freq: Every day | ORAL | 5 refills | Status: DC

## 2024-01-17 MED ORDER — SERTRALINE HCL 100 MG PO TABS: 100.0000 mg | ORAL_TABLET | Freq: Every day | ORAL | 6 refills | Status: DC

## 2024-01-17 MED ORDER — LISINOPRIL 10 MG PO TABS: 10.0000 mg | ORAL_TABLET | Freq: Every day | ORAL | 1 refills | Status: DC

## 2024-01-17 NOTE — Progress Notes (Signed)
 Patient ID: Ashley Padilla, female    DOB: 1951/12/07  MRN: [id number]  CC: Establish Care (Re-est care. /No questions / concerns/Instructed to sign ROI for med rec )   Subjective: Ashley Padilla is a 73 y.o. female who presents for re-establish care. Her concerns today include:  Pt with hx of GAD/MDD followed by psychiatry, HTN, HL, hx of partial colon resection, vit D def   Pt lived in [place]  for 2 yrs.  Decided to move back to [place].  Discussed the use of AI scribe software for clinical note transcription with the patient, who gave verbal consent to proceed.  History of Present Illness   The patient, with a history of hypertension, depression, anxiety, hyperlipidemia, and vitamin D  deficiency, presents to reestablish care with me. Pt lived in [place]  for 2 yrs.  Decided to move back to [place]. She reports a new diagnosis since she last saw me of prediabetes by her primary care provider, Dr. Jenelle, at [facility]. She has been managing this condition through dietary changes and increased physical activity, including water exercises twice a week, a weekly cardio drumming exercise, and walking three to four times a week. However, she admits to frequent dining out and a need for improvement in her dietary habits, particularly in the consumption of fresh fruits and vegetables.  She also has a history of hyperlipidemia but is currently not on any medication for it.  HTN:  The patient's hypertension is managed with lisinopril  10mg  and amlodipine  5mg , taken daily. She reports no chest pain or shortness of breath, except when walking long distances she may get a little winded.   MDD/GAD: Her depression and anxiety are managed with quetiapine  50mg  at bedtime and sertraline  100mg  daily, which she reports as effective in controlling her symptoms. She also uses diclofenac  gel as needed for pain.   On review of care everywhere, I see diagnosis of osteopenia of the lumbar spine.  She also has history of  vitamin D  deficiency.  She is taking vitamin D  supplement over-the-counter but does not recall the dose.  She will call back with that information.    HM: She reports having a fit test sometime last year and thinks she may have had bone density study several years ago as well.  Declines flu shot.  She reports having completed pneumonia and shingles vaccines.  We will pull that information from care everywhere Patient Active Problem List   Diagnosis Date Noted   Horseshoe tear of retina of left eye without detachment 04/13/2020   Nuclear sclerotic cataract of both eyes 04/13/2020   Screening mammogram, encounter for 10/19/2019   Adenomatous polyp of colon 09/07/2019   Post-menopausal bleeding 09/07/2019   Hyperlipidemia, mixed 06/25/2019   Vitamin D  deficiency 06/25/2019   Wrist pain, chronic, right 05/29/2019   Acute pain of right shoulder 05/29/2019   Essential hypertension 05/29/2019   History of wrist fracture 05/29/2019   Major depressive disorder, recurrent episode, in full remission (HCC) 01/15/2019   GAD (generalized anxiety disorder) 01/15/2019     Current Outpatient Medications on File Prior to Visit  Medication Sig Dispense Refill   cholecalciferol (VITAMIN D3) 25 MCG (1000 UNIT) tablet Take 2 tablets (2,000 Units total) by mouth daily. 100 tablet 1   diclofenac  sodium (VOLTAREN ) 1 % GEL Apply 2 g topically 4 (four) times daily as needed. 100 g 1   Multiple Vitamins-Minerals (MULTIVITAMIN WITH IRON-MINERALS) liquid Take by mouth daily.     Omega-3 Fatty Acids (FISH  OIL PO) Take 1 capsule by mouth daily with breakfast.     No current facility-administered medications on file prior to visit.    Allergies  Allergen Reactions   Penicillin V Potassium Other (See Comments)    Reaction happened as a baby- not recalled Did it involve swelling of the face/tongue/throat, SOB, or low BP? Unk Did it involve sudden or severe rash/hives, skin peeling, or any reaction on the inside of  your mouth or nose? Unk Did you need to seek medical attention at a hospital or doctor's office? Unk When did it last happen? I was a baby If all above answers are "NO", may proceed with cephalosporin use.     Penicillins Other (See Comments)    Reaction happened as a baby- not recalled Did it involve swelling of the face/tongue/throat, SOB, or low BP? Unk Did it involve sudden or severe rash/hives, skin peeling, or any reaction on the inside of your mouth or nose? Unk Did you need to seek medical attention at a hospital or doctor's office? Unk When did it last happen? I was a baby If all above answers are NO, may proceed with cephalosporin use.   Latex    Adhesive [Tape] Rash    Terrible rash developed at incision site    Social History   Socioeconomic History   Marital status: Widowed    Spouse name: Not on file   Number of children: 3   Years of education: BS degree   Highest education level: Bachelor's degree (e.g., BA, AB, BS)  Occupational History   Occupation: retired runner, broadcasting/film/video  Tobacco Use   Smoking status: Never   Smokeless tobacco: Never  Vaping Use   Vaping status: Never Used  Substance and Sexual Activity   Alcohol use: Not Currently   Drug use: Not Currently   Sexual activity: Not Currently  Other Topics Concern   Not on file  Social History Narrative   Not on file   Social Drivers of Health   Financial Resource Strain: Low Risk  (01/16/2024)   Overall Financial Resource Strain (CARDIA)    Difficulty of Paying Living Expenses: Not very hard  Food Insecurity: No Food Insecurity (01/16/2024)   Hunger Vital Sign    Worried About Running Out of Food in the Last Year: Never true    Ran Out of Food in the Last Year: Never true  Transportation Needs: No Transportation Needs (01/16/2024)   PRAPARE - Administrator, Civil Service (Medical): No    Lack of Transportation (Non-Medical): No  Physical Activity: Sufficiently Active (01/16/2024)   Exercise  Vital Sign    Days of Exercise per Week: 5 days    Minutes of Exercise per Session: 40 min  Stress: No Stress Concern Present (01/16/2024)   [employer] of Occupational Health - Occupational Stress Questionnaire    Feeling of Stress : Only a little  Social Connections: Unknown (01/16/2024)   Social Connection and Isolation Panel [NHANES]    Frequency of Communication with Friends and Family: Three times a week    Frequency of Social Gatherings with Friends and Family: Twice a week    Attends Religious Services: Patient declined    Database Administrator or Organizations: No    Attends Engineer, Structural: Not on file    Marital Status: Widowed  Intimate Partner Violence: Not At Risk (10/15/2023)   Received from [employer], Afraid, Rape, and Kick questionnaire    Fear of  Current or Ex-Partner: No    Emotionally Abused: No    Physically Abused: No    Sexually Abused: No    Family History  Problem Relation Age of Onset   Anxiety disorder Mother    Depression Mother    Alcohol abuse Father    Anxiety disorder Sister    Depression Sister    Breast cancer Sister    Anxiety disorder Maternal Grandmother    Depression Maternal Grandmother     Past Surgical History:  Procedure Laterality Date   COLON RESECTION N/A 11/2009   for presumed cancerous polyp that turned out not to be CA   COLON SURGERY  2010   Colon Resection   COLON SURGERY  2011   Ostomy reversal    polypectomy uterous  2012   WRIST FRACTURE SURGERY Bilateral 2018, 03/2019   LT 2018 and RT 2020   WRIST SURGERY Left     ROS: Review of Systems Negative except as stated above  PHYSICAL EXAM: BP 130/81   Pulse 66   Temp 98 F (36.7 C) (Oral)   Ht 5' 10 (1.778 m)   Wt 191 lb (86.6 kg)   SpO2 99%   BMI 27.41 kg/m   Physical Exam  General appearance - alert, well appearing, elderly Caucasian female and in no distress Mental status - normal mood, behavior, speech, dress,  motor activity, and thought processes Neck - supple, no significant adenopathy Chest - clear to auscultation, no wheezes, rales or rhonchi, symmetric air entry Heart - normal rate, regular rhythm, normal S1, S2, no murmurs, rubs, clicks or gallops Extremities - peripheral pulses normal, no pedal edema, no clubbing or cyanosis     01/17/2024    9:39 AM 02/06/2021   10:44 AM 10/03/2020   10:55 AM  Depression screen PHQ 2/9  Decreased Interest 1 1 0  Down, Depressed, Hopeless 1 1 0  PHQ - 2 Score 2 2 0  Altered sleeping 0 0   Tired, decreased energy 0 1   Change in appetite 1 1   Feeling bad or failure about yourself  0 1   Trouble concentrating 0 0   Moving slowly or fidgety/restless 0 0   Suicidal thoughts 0 0   PHQ-9 Score 3 5   Difficult doing work/chores Somewhat difficult         Latest Ref Rng & Units 11/16/2020   10:35 AM 06/24/2019   10:08 AM  CMP  Glucose 65 - 99 mg/dL 93  92   BUN 8 - 27 mg/dL 14  13   Creatinine 9.42 - 1.00 mg/dL 9.10  9.10   Sodium 865 - 144 mmol/L 143  145   Potassium 3.5 - 5.2 mmol/L 3.7  4.0   Chloride 96 - 106 mmol/L 103  105   CO2 20 - 29 mmol/L 25  23   Calcium 8.7 - 10.3 mg/dL 8.9  9.2   Total Protein 6.0 - 8.5 g/dL 6.6  6.6   Total Bilirubin 0.0 - 1.2 mg/dL 0.4  0.6   Alkaline Phos 44 - 121 IU/L 61  70   AST 0 - 40 IU/L 17  15   ALT 0 - 32 IU/L 14  20    Lipid Panel     Component Value Date/Time   CHOL 270 (H) 11/16/2020 1035   TRIG 132 11/16/2020 1035   HDL 63 11/16/2020 1035   CHOLHDL 4.3 11/16/2020 1035   LDLCALC 183 (H) 11/16/2020 1035  CBC    Component Value Date/Time   WBC 4.9 11/16/2020 1035   RBC 4.52 11/16/2020 1035   HGB 12.9 11/16/2020 1035   HCT 39.4 11/16/2020 1035   PLT 149 (L) 11/16/2020 1035   MCV 87 11/16/2020 1035   MCH 28.5 11/16/2020 1035   MCHC 32.7 11/16/2020 1035   RDW 13.8 11/16/2020 1035    ASSESSMENT AND PLAN: 1. Encounter to establish care (Primary)  2. Essential hypertension Close to  goal.  Continue lisinopril  10 mg daily and amlodipine  5 mg daily.  DASH diet encouraged. - lisinopril  (ZESTRIL ) 10 MG tablet; Take 1 tablet (10 mg total) by mouth daily.  Dispense: 90 tablet; Refill: 1 - amLODipine  (NORVASC ) 5 MG tablet; Take 1 tablet (5 mg total) by mouth daily.  Dispense: 90 tablet; Refill: 1 - CBC - Comprehensive metabolic panel  3. Prediabetes Dietary counseling given along with printed information.  Discussed and encourage meal planning so that she is not eating out as much.  Continue regular exercise. - Hemoglobin A1c  4. Osteopenia after menopause Continue vitamin D  supplement.  She will call back to let me know what dose she is taking. - DG Bone Density; Future  5. Hyperlipidemia, mixed Dietary counseling given. - Lipid panel  6. Postmenopausal estrogen deficiency - DG Bone Density; Future  7. Vitamin D  deficiency - VITAMIN D  25 Hydroxy (Vit-D Deficiency, Fractures)  8. Depressive disorder in remission 9. GAD (generalized anxiety disorder) Patient reports doing well on Seroquel  and Zoloft .  Refills were sent  10. Encounter for screening mammogram for malignant neoplasm of breast Mammogram ordered.  11. Need for influenza vaccination Given today.         Patient was given the opportunity to ask questions.  Patient verbalized understanding of the plan and was able to repeat key elements of the plan.   This documentation was completed using Paediatric nurse.  Any transcriptional errors are unintentional.  Orders Placed This Encounter  Procedures   DG Bone Density   Flu Vaccine Trivalent High Dose (Fluad)   CBC   Comprehensive metabolic panel   Lipid panel   Hemoglobin A1c   VITAMIN D  25 Hydroxy (Vit-D Deficiency, Fractures)     Requested Prescriptions   Signed Prescriptions Disp Refills   lisinopril  (ZESTRIL ) 10 MG tablet 90 tablet 1    Sig: Take 1 tablet (10 mg total) by mouth daily.   amLODipine  (NORVASC ) 5 MG  tablet 90 tablet 1    Sig: Take 1 tablet (5 mg total) by mouth daily.   QUEtiapine  (SEROQUEL ) 50 MG tablet 30 tablet 5    Sig: Take 1 tablet (50 mg total) by mouth daily. Ke 1 tablet (50 mg total) by mouth at bedtime   sertraline  (ZOLOFT ) 100 MG tablet 30 tablet 6    Sig: Take 1 tablet (100 mg total) by mouth daily.    Return in about 4 months (around 05/16/2024).  Barnie Louder, MD, FACP

## 2024-01-17 NOTE — Patient Instructions (Signed)

## 2024-01-18 ENCOUNTER — Encounter: Payer: Self-pay | Admitting: Internal Medicine

## 2024-01-18 LAB — COMPREHENSIVE METABOLIC PANEL
ALT: 15 [IU]/L (ref 0–32)
AST: 20 [IU]/L (ref 0–40)
Albumin: 4.3 g/dL (ref 3.8–4.8)
Alkaline Phosphatase: 59 [IU]/L (ref 44–121)
BUN/Creatinine Ratio: 16 (ref 12–28)
BUN: 15 mg/dL (ref 8–27)
Bilirubin Total: 0.3 mg/dL (ref 0.0–1.2)
CO2: 22 mmol/L (ref 20–29)
Calcium: 9 mg/dL (ref 8.7–10.3)
Chloride: 105 mmol/L (ref 96–106)
Creatinine, Ser: 0.91 mg/dL (ref 0.57–1.00)
Globulin, Total: 2.2 g/dL (ref 1.5–4.5)
Glucose: 104 mg/dL — ABNORMAL HIGH (ref 70–99)
Potassium: 4 mmol/L (ref 3.5–5.2)
Sodium: 144 mmol/L (ref 134–144)
Total Protein: 6.5 g/dL (ref 6.0–8.5)
eGFR: 67 mL/min/{1.73_m2} (ref >59)

## 2024-01-18 LAB — CBC
Hematocrit: 39.7 % (ref 34.0–46.6)
Hemoglobin: 12.8 g/dL (ref 11.1–15.9)
MCH: 29.5 pg (ref 26.6–33.0)
MCHC: 32.2 g/dL (ref 31.5–35.7)
MCV: 92 fL (ref 79–97)
Platelets: 142 10*3/uL — ABNORMAL LOW (ref 150–450)
RBC: 4.34 x10E6/uL (ref 3.77–5.28)
RDW: 13.5 % (ref 11.7–15.4)
WBC: 4.8 10*3/uL (ref 3.4–10.8)

## 2024-01-18 LAB — LIPID PANEL
Chol/HDL Ratio: 3.4 {ratio} (ref 0.0–4.4)
Cholesterol, Total: 247 mg/dL — ABNORMAL HIGH (ref 100–199)
HDL: 73 mg/dL (ref >39)
LDL Chol Calc (NIH): 158 mg/dL — ABNORMAL HIGH (ref 0–99)
Triglycerides: 93 mg/dL (ref 0–149)
VLDL Cholesterol Cal: 16 mg/dL (ref 5–40)

## 2024-01-18 LAB — HEMOGLOBIN A1C
Est. average glucose Bld gHb Est-mCnc: 114 mg/dL
Hgb A1c MFr Bld: 5.6 % (ref 4.8–5.6)

## 2024-01-18 LAB — VITAMIN D 25 HYDROXY (VIT D DEFICIENCY, FRACTURES): Vit D, 25-Hydroxy: 30.9 ng/mL (ref 30.0–100.0)

## 2024-01-22 ENCOUNTER — Encounter: Payer: Self-pay | Admitting: Internal Medicine

## 2024-01-22 ENCOUNTER — Ambulatory Visit
Admission: RE | Admit: 2024-01-22 | Discharge: 2024-01-22 | Disposition: A | Discharge status: Home / Self Care | Payer: Medicare Other | Source: Ambulatory Visit | Attending: Internal Medicine | Admitting: Internal Medicine

## 2024-01-22 ENCOUNTER — Telehealth: Payer: Self-pay | Admitting: Internal Medicine

## 2024-01-22 DIAGNOSIS — Z1231 Encounter for screening mammogram for malignant neoplasm of breast: Secondary | ICD-10-CM

## 2024-01-22 NOTE — Telephone Encounter (Signed)
I received request from pt for letter indicating that she can use the work out facility at [employer].  She dropped off her signed participant registration and waiver form.   Letter completed. Please leave on my desk for signature. Please make copy of her signed participant registration and waiver form for our records and give her back the original..

## 2024-01-22 NOTE — Progress Notes (Signed)
Received bone density report done 01/03/2022 at [facility] [place] Report states that she has severe osteopenia at the lumbar spine with a T-score of -2.1, mild osteopenia at the left and right femoral neck with T-score -1.2.  The 10-year probability of major osteoporotic fracture is 14.6% of hip fracture is 1.6%

## 2024-01-24 ENCOUNTER — Encounter: Payer: Self-pay | Admitting: Internal Medicine

## 2024-01-24 NOTE — Telephone Encounter (Signed)
Called & spoke to the patient. Verified name & DOB. Informed that letter & form are ready for pick up. Patient expressed verba understanding.  Requested copies made.

## 2024-02-13 ENCOUNTER — Ambulatory Visit: Payer: Self-pay | Admitting: Internal Medicine

## 2024-02-13 NOTE — Telephone Encounter (Signed)
 Chief Complaint: Covid positive Symptoms: Weakness, headache, mild cough Frequency: since yesterady Pertinent Negatives: Patient denies fever, shortness of breath Disposition: [] ED /[] Urgent Care (no appt availability in office) / [] Appointment(In office/virtual)/ []  [facility]/ [x] Home Care/ [] Refused Recommended Disposition /[] [facility]/ []  Follow-up with PCP Additional Notes: Patient tested positive for covid yesterday with home test. Patient states her symptoms are muscle weakness, mild cough, headache. Patient denies shortness of breath or fever. Patient is taking Coricidin HBP for symptom management and states it is helping. Patient advised to call back if she begins running high fever or becomes short of breath.    Copied from CRM [id number]. Topic: Clinical - Red Word Triage >> Feb 13, 2024  5:57 PM Priscille Loveless wrote: Red Word that prompted transfer to Nurse Triage: pt took a covid test and its positive. Can she get a rx? Reason for Disposition  [1] COVID-19 diagnosed by doctor (or NP/PA) AND [2] mild symptoms (e.g., cough, fever, others) AND [3] no complications or SOB  Answer Assessment - Initial Assessment Questions 1. COVID-19 DIAGNOSIS: "How do you know that you have COVID?" (e.g., positive lab test or self-test, diagnosed by doctor or NP/PA, symptoms after exposure).     Home test 2. COVID-19 EXPOSURE: "Was there any known exposure to COVID before the symptoms began?" CDC Definition of close contact: within 6 feet (2 meters) for a total of 15 minutes or more over a 24-hour period.      No  3. ONSET: "When did the COVID-19 symptoms start?"      Last night 4. WORST SYMPTOM: "What is your worst symptom?" (e.g., cough, fever, shortness of breath, muscle aches)     Weakness 5. COUGH: "Do you have a cough?" If Yes, ask: "How bad is the cough?"       Yes (dry cough) 6. FEVER: "Do you have a fever?" If Yes, ask: "What is your temperature, how was it measured, and  when did it start?"     Patient is unsure but doesn't think so  7. RESPIRATORY STATUS: "Describe your breathing?" (e.g., normal; shortness of breath, wheezing, unable to speak)      Breathing normally 8. BETTER-SAME-WORSE: "Are you getting better, staying the same or getting worse compared to yesterday?"  If getting worse, ask, "In what way?"     Mildly better 9. OTHER SYMPTOMS: "Do you have any other symptoms?"  (e.g., chills, fatigue, headache, loss of smell or taste, muscle pain, sore throat)     Dry cough, weakness, headache 10. HIGH RISK DISEASE: "Do you have any chronic medical problems?" (e.g., asthma, heart or lung disease, weak immune system, obesity, etc.)       High blood pressure 13. O2 SATURATION MONITOR:  "Do you use an oxygen saturation monitor (pulse oximeter) at home?" If Yes, ask "What is your reading (oxygen level) today?" "What is your usual oxygen saturation reading?" (e.g., 95%)       No  Protocols used: Coronavirus (COVID-19) Diagnosed or Suspected-A-AH

## 2024-02-14 ENCOUNTER — Telehealth: Admitting: Internal Medicine

## 2024-02-14 DIAGNOSIS — U071 COVID-19: Secondary | ICD-10-CM

## 2024-02-14 MED ORDER — NIRMATRELVIR/RITONAVIR (PAXLOVID)TABLET: 3.0000 | ORAL_TABLET | Freq: Two times a day (BID) | ORAL | 0 refills | Status: AC

## 2024-02-14 NOTE — Telephone Encounter (Signed)
 See if she wants to do Video Visit at 1:20 p.m today.

## 2024-02-14 NOTE — Progress Notes (Signed)
 Patient ID: Ashley Padilla, female   DOB: Jun 17, 1951, 73 y.o.   MRN: [id number] Virtual Visit via Video Note  I connected with Ashley Baas Rider on 02/14/2024 at 1:38 PM by a video enabled telemedicine application and verified that I am speaking with the correct person using two identifiers.  Location: Patient: home Provider: Office   I discussed the limitations of evaluation and management by telemedicine and the availability of in person appointments. The patient expressed understanding and agreed to proceed.  History of Present Illness: Pt with hx of GAD/MDD followed by psychiatry, HTN, HL, hx of partial colon resection, vit D def   Discussed the use of AI scribe software for clinical note transcription with the patient, who gave verbal consent to proceed.  History of Present Illness   This was an urgent care visit for recent diagnosis of COVID.  Patient presents with a dry, constant cough and headache that began 2 nights ago.  She denies any fever or shortness of breath.  She has had mild chest congestion.  A little sore throat initially but that has since resolved.  She has been using Coricidin HBP with acetaminophen.  Home COVID test was positive yesterday.  She has had COVID vaccines.       Outpatient Encounter Medications as of 02/14/2024  Medication Sig   amLODipine (NORVASC) 5 MG tablet Take 1 tablet (5 mg total) by mouth daily.   cholecalciferol (VITAMIN D3) 25 MCG (1000 UNIT) tablet Take 2 tablets (2,000 Units total) by mouth daily.   diclofenac sodium (VOLTAREN) 1 % GEL Apply 2 g topically 4 (four) times daily as needed.   lisinopril (ZESTRIL) 10 MG tablet Take 1 tablet (10 mg total) by mouth daily.   Multiple Vitamins-Minerals (MULTIVITAMIN WITH IRON-MINERALS) liquid Take by mouth daily.   Omega-3 Fatty Acids (FISH OIL PO) Take 1 capsule by mouth daily with breakfast.   QUEtiapine (SEROQUEL) 50 MG tablet Take 1 tablet (50 mg total) by mouth daily. Ke 1 tablet (50 mg total) by mouth  at bedtime   sertraline (ZOLOFT) 100 MG tablet Take 1 tablet (100 mg total) by mouth daily.   No facility-administered encounter medications on file as of 02/14/2024.      Observations/Objective: Elderly Caucasian female sitting in chair in NAD.  She is able to talk in complete sentences.  She does not appear toxic.  Assessment and Plan: 1. COVID-19 virus infection (Primary) We discussed putting her on Paxlovid.  Advised that the medication helps prevent hospitalization.  However I went over potential side effects including change in taste and diarrhea.  Also advised her to stop the Seroquel during the time that she is on the Paxlovid because level of Seroquel can be increased in the system.  Patient states she is not sure whether she will take the Paxlovid or not but would like for me to send a prescription to her pharmacy in case she decides to go ahead and take it.  Advised that it should be taken within the first 5 days of symptom onset.  Otherwise she can continue the Coricidin HBP as needed for congestion and cough.  Advised to be seen if she develops shortness of breath or high fever.  Continue to wear a mask when around others until symptoms especially cough resolves.  All questions were answered. - nirmatrelvir/ritonavir (PAXLOVID) 20 x 150 MG & 10 x 100MG  TABS; Take 3 tablets by mouth 2 (two) times daily for 5 days. (Take nirmatrelvir 150 mg two tablets  twice daily for 5 days and ritonavir 100 mg one tablet twice daily for 5 days) Patient GFR is 67  Dispense: 30 tablet; Refill: 0   Follow Up Instructions: prn   I discussed the assessment and treatment plan with the patient. The patient was provided an opportunity to ask questions and all were answered. The patient agreed with the plan and demonstrated an understanding of the instructions.   The patient was advised to call back or seek an in-person evaluation if the symptoms worsen or if the condition fails to improve as anticipated.  I  spent 12 minutes dedicated to the care of this patient on the date of this encounter to include previsit review of of patient's chart, face-to-face time with patient discussing diagnosis and management and post visit entering of order.  This note has been created with Education officer, environmental. Any transcriptional errors are unintentional.  Jonah Blue, MD

## 2024-05-14 ENCOUNTER — Ambulatory Visit: Payer: Medicare Other | Admitting: Internal Medicine

## 2024-06-16 ENCOUNTER — Ambulatory Visit: Attending: Internal Medicine | Admitting: Internal Medicine

## 2024-06-16 ENCOUNTER — Ambulatory Visit
Admission: RE | Admit: 2024-06-16 | Discharge: 2024-06-16 | Disposition: A | Discharge status: Home / Self Care | Source: Ambulatory Visit | Attending: Internal Medicine | Admitting: Internal Medicine

## 2024-06-16 ENCOUNTER — Encounter: Payer: Self-pay | Admitting: Internal Medicine

## 2024-06-16 VITALS — BP 122/73 | HR 62 | Temp 98.1°F | Ht 70.0 in | Wt 191.0 lb

## 2024-06-16 DIAGNOSIS — M79675 Pain in left toe(s): Secondary | ICD-10-CM | POA: Diagnosis not present

## 2024-06-16 DIAGNOSIS — I10 Essential (primary) hypertension: Secondary | ICD-10-CM

## 2024-06-16 DIAGNOSIS — E559 Vitamin D deficiency, unspecified: Secondary | ICD-10-CM | POA: Diagnosis not present

## 2024-06-16 DIAGNOSIS — Z23 Encounter for immunization: Secondary | ICD-10-CM

## 2024-06-16 DIAGNOSIS — D696 Thrombocytopenia, unspecified: Secondary | ICD-10-CM

## 2024-06-16 DIAGNOSIS — R0982 Postnasal drip: Secondary | ICD-10-CM

## 2024-06-16 DIAGNOSIS — Z1211 Encounter for screening for malignant neoplasm of colon: Secondary | ICD-10-CM

## 2024-06-16 DIAGNOSIS — E782 Mixed hyperlipidemia: Secondary | ICD-10-CM

## 2024-06-16 MED ORDER — SULFAMETHOXAZOLE-TRIMETHOPRIM 800-160 MG PO TABS: 1.0000 | ORAL_TABLET | Freq: Two times a day (BID) | ORAL | 0 refills | Status: DC

## 2024-06-16 NOTE — Progress Notes (Signed)
 Patient ID: Ashley Padilla, female    DOB: June 24, 1951  MRN: [id number]  CC: Hypertension (HTN f/u. Mar[place], redness of toe on L foot, painful to touch x1 week/Brought in pictures of vitamin D  supplements /Pneumonia vax administered 06/16/2024 - C.A.)   Subjective: Ashley Padilla is a 73 y.o. female who presents for chronic ds management. Her concerns today include:  Pt with hx of GAD/MDD followed by psychiatry, HTN, HL, hx of partial colon resection, vit D def, preDM  Discussed the use of AI scribe software for clinical note transcription with the patient, who gave verbal consent to proceed.  History of Present Illness Ashley Padilla is a 73 year old female who presents for a routine follow-up.  She has swelling and redness of the 2nd toe on her left foot for about a week and a half. The toe is tender to touch but not painful when wearing shoes. There is no recollection of specific injury, but she has increased walking and mowing, possibly without proper footwear. Voltaren  gel has been used without resolution of symptoms.  Osteopenia/vitamin D  deficiency: She takes 3,000 IU of vitamin D  daily, with her last level at 30.9 ng/mL five months ago. Physical activity has increased to walking for about two hours, four times a week.   She is on lisinopril  10 mg and amlodipine  5 mg daily for hypertension and limits salt intake. No chest pain or shortness of breath.   She has history of mixed hyperlipidemia and is reluctant to take statin therapy.    Reports some drainage at the back of the throat in the mornings which causes cough.  On last 2 CBCs, she had slight decrease in platelet cell count.  She does not have any bruising or bleeding.    Patient Active Problem List   Diagnosis Date Noted   Horseshoe tear of retina of left eye without detachment 04/13/2020   Nuclear sclerotic cataract of both eyes 04/13/2020   Screening mammogram, encounter for 10/19/2019   Adenomatous polyp of colon  09/07/2019   Post-menopausal bleeding 09/07/2019   Hyperlipidemia, mixed 06/25/2019   Vitamin D  deficiency 06/25/2019   Wrist pain, chronic, right 05/29/2019   Acute pain of right shoulder 05/29/2019   Essential hypertension 05/29/2019   History of wrist fracture 05/29/2019   Major depressive disorder, recurrent episode, in full remission (HCC) 01/15/2019   GAD (generalized anxiety disorder) 01/15/2019     Current Outpatient Medications on File Prior to Visit  Medication Sig Dispense Refill   amLODipine  (NORVASC ) 5 MG tablet Take 1 tablet (5 mg total) by mouth daily. 90 tablet 1   cholecalciferol (VITAMIN D3) 25 MCG (1000 UNIT) tablet Take 2 tablets (2,000 Units total) by mouth daily. 100 tablet 1   diclofenac  sodium (VOLTAREN ) 1 % GEL Apply 2 g topically 4 (four) times daily as needed. 100 g 1   lisinopril  (ZESTRIL ) 10 MG tablet Take 1 tablet (10 mg total) by mouth daily. 90 tablet 1   Multiple Vitamins-Minerals (MULTIVITAMIN WITH IRON-MINERALS) liquid Take by mouth daily.     Omega-3 Fatty Acids (FISH OIL PO) Take 1 capsule by mouth daily with breakfast.     QUEtiapine  (SEROQUEL ) 50 MG tablet Take 1 tablet (50 mg total) by mouth daily. Ke 1 tablet (50 mg total) by mouth at bedtime 30 tablet 5   sertraline  (ZOLOFT ) 100 MG tablet Take 1 tablet (100 mg total) by mouth daily. 30 tablet 6   No current facility-administered medications on file prior  to visit.    Allergies  Allergen Reactions   Penicillin V Potassium Other (See Comments)    Reaction happened as a baby- not recalled Did it involve swelling of the face/tongue/throat, SOB, or low BP? Unk Did it involve sudden or severe rash/hives, skin peeling, or any reaction on the inside of your mouth or nose? Unk Did you need to seek medical attention at a hospital or doctor's office? Unk When did it last happen? I was a baby If all above answers are "NO", may proceed with cephalosporin use.     Penicillins Other (See Comments)     Reaction happened as a baby- not recalled Did it involve swelling of the face/tongue/throat, SOB, or low BP? Unk Did it involve sudden or severe rash/hives, skin peeling, or any reaction on the inside of your mouth or nose? Unk Did you need to seek medical attention at a hospital or doctor's office? Unk When did it last happen? I was a baby If all above answers are NO, may proceed with cephalosporin use.   Latex    Adhesive [Tape] Rash    Terrible rash developed at incision site    Social History   Socioeconomic History   Marital status: Widowed    Spouse name: Not on file   Number of children: 3   Years of education: BS degree   Highest education level: Bachelor's degree (e.g., BA, AB, BS)  Occupational History   Occupation: retired Runner, broadcasting/film/video  Tobacco Use   Smoking status: Never   Smokeless tobacco: Never  Vaping Use   Vaping status: Never Used  Substance and Sexual Activity   Alcohol use: Not Currently   Drug use: Not Currently   Sexual activity: Not Currently  Other Topics Concern   Not on file  Social History Narrative   Not on file   Social Drivers of Health   Financial Resource Strain: Low Risk  (06/15/2024)   Overall Financial Resource Strain (CARDIA)    Difficulty of Paying Living Expenses: Not very hard  Food Insecurity: No Food Insecurity (06/15/2024)   Hunger Vital Sign    Worried About Running Out of Food in the Last Year: Never true    Ran Out of Food in the Last Year: Never true  Transportation Needs: No Transportation Needs (06/15/2024)   PRAPARE - Administrator, Civil Service (Medical): No    Lack of Transportation (Non-Medical): No  Physical Activity: Sufficiently Active (06/15/2024)   Exercise Vital Sign    Days of Exercise per Week: 4 days    Minutes of Exercise per Session: 40 min  Stress: No Stress Concern Present (06/15/2024)   [employer] of Occupational Health - Occupational Stress Questionnaire    Feeling of Stress: Only a  little  Social Connections: Unknown (06/15/2024)   Social Connection and Isolation Panel    Frequency of Communication with Friends and Family: More than three times a week    Frequency of Social Gatherings with Friends and Family: More than three times a week    Attends Religious Services: Patient declined    Database administrator or Organizations: No    Attends Banker Meetings: Not on file    Marital Status: Not on file  Intimate Partner Violence: Not At Risk (10/15/2023)   Received from [employer], Afraid, Rape, and Kick questionnaire    Within the last year, have you been afraid of your partner or ex-partner?: No    Within  the last year, have you been humiliated or emotionally abused in other ways by your partner or ex-partner?: No    Within the last year, have you been kicked, hit, slapped, or otherwise physically hurt by your partner or ex-partner?: No    Within the last year, have you been raped or forced to have any kind of sexual activity by your partner or ex-partner?: No    Family History  Problem Relation Age of Onset   Anxiety disorder Mother    Depression Mother    Alcohol abuse Father    Anxiety disorder Sister    Depression Sister    Breast cancer Sister    Anxiety disorder Maternal Grandmother    Depression Maternal Grandmother     Past Surgical History:  Procedure Laterality Date   COLON RESECTION N/A 11/2009   for presumed cancerous polyp that turned out not to be CA   COLON SURGERY  2010   Colon Resection   COLON SURGERY  2011   Ostomy reversal    polypectomy uterous  2012   WRIST FRACTURE SURGERY Bilateral 2018, 03/2019   LT 2018 and RT 2020   WRIST SURGERY Left     ROS: Review of Systems Negative except as stated above  PHYSICAL EXAM: BP 122/73 (BP Location: Left Arm, Patient Position: Sitting, Cuff Size: Normal)   Pulse 62   Temp 98.1 F (36.7 C) (Oral)   Ht 5' 10 (1.778 m)   Wt 191 lb (86.6 kg)   SpO2 97%    BMI 27.41 kg/m   Physical Exam  General appearance - alert, well appearing, and in no distress Mental status - normal mood, behavior, speech, dress, motor activity, and thought processes Nose - normal and patent, no erythema, discharge or polyps Mouth - mucous membranes moist, pharynx normal without lesions Neck - supple, no significant adenopathy Chest - clear to auscultation, no wheezes, rales or rhonchi, symmetric air entry Heart - normal rate, regular rhythm, normal S1, S2, no murmurs, rubs, clicks or gallops Musculoskeletal -left second toe is edematous and mildly erythematous on the dorsal surface.  Slight tenderness to touch but has good range of motion Extremities - peripheral pulses normal, no pedal edema, no clubbing or cyanosis   The 10-year ASCVD risk score (Arnett DK, et al., 2019) is: 14.4%   Values used to calculate the score:     Age: 68 years     Clincally relevant sex: Female     Is Non-Hispanic African American: No     Diabetic: No     Tobacco smoker: No     Systolic Blood Pressure: 122 mmHg     Is BP treated: Yes     HDL Cholesterol: 73 mg/dL     Total Cholesterol: 247 mg/dL     Latest Ref Rng & Units 01/17/2024    9:44 AM 11/16/2020   10:35 AM 06/24/2019   10:08 AM  CMP  Glucose 70 - 99 mg/dL 895  93  92   BUN 8 - 27 mg/dL 15  14  13    Creatinine 0.57 - 1.00 mg/dL 9.08  9.10  9.10   Sodium 134 - 144 mmol/L 144  143  145   Potassium 3.5 - 5.2 mmol/L 4.0  3.7  4.0   Chloride 96 - 106 mmol/L 105  103  105   CO2 20 - 29 mmol/L 22  25  23    Calcium 8.7 - 10.3 mg/dL 9.0  8.9  9.2   Total  Protein 6.0 - 8.5 g/dL 6.5  6.6  6.6   Total Bilirubin 0.0 - 1.2 mg/dL 0.3  0.4  0.6   Alkaline Phos 44 - 121 IU/L 59  61  70   AST 0 - 40 IU/L 20  17  15    ALT 0 - 32 IU/L 15  14  20     Lipid Panel     Component Value Date/Time   CHOL 247 (H) 01/17/2024 0944   TRIG 93 01/17/2024 0944   HDL 73 01/17/2024 0944   CHOLHDL 3.4 01/17/2024 0944   LDLCALC 158 (H) 01/17/2024  0944    CBC    Component Value Date/Time   WBC 4.8 01/17/2024 0944   RBC 4.34 01/17/2024 0944   HGB 12.8 01/17/2024 0944   HCT 39.7 01/17/2024 0944   PLT 142 (L) 01/17/2024 0944   MCV 92 01/17/2024 0944   MCH 29.5 01/17/2024 0944   MCHC 32.2 01/17/2024 0944   RDW 13.5 01/17/2024 0944    ASSESSMENT AND PLAN: 1. Essential hypertension (Primary) At goal.  Continue amlodipine  5 mg daily and lisinopril  10 mg daily.  2. Hyperlipidemia, mixed Not at goal with elevated ASCVD score.  Discussed statin therapy but patient is not interested in doing so.  She states she will continue to work on her eating habits and exercising  3. Vitamin D  deficiency Continue vitamin D  3000 IU daily - VITAMIN D  25 Hydroxy (Vit-D Deficiency, Fractures)  4. Need for vaccination against Streptococcus pneumoniae - Pneumococcal conjugate vaccine 20-valent  6. Toe pain, left Questionable etiology.  Given that she has been doing a lot more walking, we will check an x-ray to make sure she does not have an occult fracture.  I advised taking Advil 2 tablets twice a day for the next 5 to 7 days to decrease inflammation.  Use warm compresses.  Will also give a course of antibiotics in case this is an infection trying to develop.  Follow-up if no improvement. - DG Toe 2nd Left; Future - sulfamethoxazole -trimethoprim  (BACTRIM  DS) 800-160 MG tablet; Take 1 tablet by mouth 2 (two) times daily.  Dispense: 14 tablet; Refill: 0  7. Postnasal drip Recommend over-the-counter Claritin and Flonase nasal spray  8. Thrombocytopenia (HCC) Will continue to trend this.  Patient asymptomatic at this time - CBC  9. Screening for colon cancer Discussed colon cancer screening.  Patient does not wish to do any more colonoscopy stating that her labs to were normal.  She prefers to do fit test instead. - Fecal occult blood, imunochemical([facility])   Patient was given the opportunity to ask questions.  Patient verbalized  understanding of the plan and was able to repeat key elements of the plan.   This documentation was completed using Paediatric nurse.  Any transcriptional errors are unintentional.  Orders Placed This Encounter  Procedures   Fecal occult blood, imunochemical([facility])   DG Toe 2nd Left   Pneumococcal conjugate vaccine 20-valent   VITAMIN D  25 Hydroxy (Vit-D Deficiency, Fractures)   CBC     Requested Prescriptions   Signed Prescriptions Disp Refills   sulfamethoxazole -trimethoprim  (BACTRIM  DS) 800-160 MG tablet 14 tablet 0    Sig: Take 1 tablet by mouth 2 (two) times daily.    Return in about 4 months (around 10/17/2024).  Barnie Louder, MD, FACP

## 2024-06-16 NOTE — Patient Instructions (Addendum)
 VISIT SUMMARY:  Today, we discussed several health concerns and made plans to address them. You have swelling and redness in your left toe, and we will investigate further with an x-ray. We also reviewed your hypertension, cholesterol levels, and bone health, and made plans for routine health maintenance.  YOUR PLAN:  Go to [facility] imaging at [address] to have x-ray done of the left second toe.  Postnasal drip: get Claritin from over the counter and use daily as needed  -TOE SWELLING AND REDNESS: You have swelling and redness in your left toe, which could be due to inflammation, arthritis, or a fracture. We will do an x-ray to check for any fractures. In the meantime, take ibuprofen or naproxen twice daily with food for 5-7 days, use warm compresses, elevate your foot, and wear supportive shoes. Avoid walking if it causes discomfort. We also prescribed antibiotics for 7 days to address any possible infection.  -HYPERTENSION: Your blood pressure is well-controlled with your current medications, lisinopril  and amlodipine . Continue taking these medications and limit your salt intake as discussed.  -HYPERLIPIDEMIA: Your LDL cholesterol is elevated. We discussed making dietary changes and the potential benefits of seeing a nutritionist. Focus on improving your diet and lifestyle to help manage your cholesterol levels.  -THROMBOCYTOPENIA: You have a mild decrease in your platelet count. We will repeat the platelet count to monitor this condition.  -OSTEOPENIA: You have slight bone demineralization. Continue taking 3000 IU of vitamin D  daily, and we will recheck your vitamin D  levels. If your levels remain low, we may increase your dose to 4000 IU.  -GENERAL HEALTH MAINTENANCE: We discussed routine health maintenance. You agreed to receive the pneumonia vaccine and to complete an annual FIT test for colon cancer screening.  INSTRUCTIONS:  Please follow up with the x-ray of your left  toe as soon as possible. Continue taking your current medications and follow the advice given for each health issue. We will recheck your platelet count and vitamin D  levels at your next visit. Complete the FIT test and return it as instructed. If you have any questions or concerns, please contact our office.

## 2024-06-17 ENCOUNTER — Ambulatory Visit: Payer: Self-pay | Admitting: Internal Medicine

## 2024-06-17 LAB — VITAMIN D 25 HYDROXY (VIT D DEFICIENCY, FRACTURES): Vit D, 25-Hydroxy: 33.8 ng/mL (ref 30.0–100.0)

## 2024-06-17 LAB — CBC
Hematocrit: 43.5 % (ref 34.0–46.6)
Hemoglobin: 14.2 g/dL (ref 11.1–15.9)
MCH: 30.2 pg (ref 26.6–33.0)
MCHC: 32.6 g/dL (ref 31.5–35.7)
MCV: 93 fL (ref 79–97)
Platelets: 150 x10E3/uL (ref 150–450)
RBC: 4.7 x10E6/uL (ref 3.77–5.28)
RDW: 13.5 % (ref 11.7–15.4)
WBC: 6.4 x10E3/uL (ref 3.4–10.8)

## 2024-06-19 LAB — FECAL OCCULT BLOOD, IMMUNOCHEMICAL: Fecal Occult Bld: NEGATIVE

## 2024-06-26 ENCOUNTER — Telehealth: Payer: Self-pay | Admitting: Internal Medicine

## 2024-06-26 DIAGNOSIS — R7303 Prediabetes: Secondary | ICD-10-CM

## 2024-06-26 DIAGNOSIS — E782 Mixed hyperlipidemia: Secondary | ICD-10-CM

## 2024-06-26 NOTE — Telephone Encounter (Signed)
 Copied from CRM [id number]. Topic: General - Other >> Jun 26, 2024  3:11 PM Deleta S wrote:  Reason for CRM: patient would like for pcp nurse to give her a call regarding medication and health. You can reach the patient at [phone]

## 2024-06-26 NOTE — Telephone Encounter (Addendum)
 Patient called the office requesting for a referral to a dietician to help lower her cholesterol level. Please advise.

## 2024-06-27 NOTE — Telephone Encounter (Signed)
 Referral submitted to nutritionist.

## 2024-06-29 NOTE — Telephone Encounter (Signed)
 Left message on voicemail to return call.  Advised on voicemail, referral was sent.

## 2024-07-07 ENCOUNTER — Other Ambulatory Visit: Payer: Self-pay | Admitting: Internal Medicine

## 2024-07-07 DIAGNOSIS — I10 Essential (primary) hypertension: Secondary | ICD-10-CM

## 2024-07-08 NOTE — Telephone Encounter (Signed)
 Requested Prescriptions  Pending Prescriptions Disp Refills   amLODipine  (NORVASC ) 5 MG tablet [Pharmacy Med Name: AMLODIPINE  BESYLATE 5 MG TAB] 90 tablet 1    Sig: TAKE 1 TABLET (5 MG TOTAL) BY MOUTH DAILY.     Cardiovascular: Calcium Channel Blockers 2 Passed - 07/08/2024  8:42 AM      Passed - Last BP in normal range    BP Readings from Last 1 Encounters:  06/16/24 122/73         Passed - Last Heart Rate in normal range    Pulse Readings from Last 1 Encounters:  06/16/24 62         Passed - Valid encounter within last 6 months    Recent Outpatient Visits           3 weeks ago Essential hypertension   [facility] [place] Barnie NOVAK, MD   4 months ago COVID-19 virus infection   [facility] [place]. [facility] Barnie NOVAK, MD   5 months ago Encounter to establish care   [facility] [place]. [facility] Barnie NOVAK, MD   3 years ago Essential hypertension   [facility] [place]. [facility] Barnie NOVAK, MD   3 years ago Essential hypertension    [facility] [place]. [facility] Barnie NOVAK, MD       Future Appointments             In 3 months [facility] Barnie NOVAK, MD [facility] [place]

## 2024-07-09 NOTE — Telephone Encounter (Signed)
 Copied from CRM [id number]. Topic: Referral - Status >> Jul 09, 2024  3:19 PM Ivette P wrote:  Reason for CRM: Pt called in to get an update on the Dietician referral, pt las heard that on 06/29/2024 that referral was sent. Pt would like to know if she has to reach out to schedule or does someone call her.    Pt callback [phone]

## 2024-07-10 NOTE — Addendum Note (Signed)
 Addended by: Jourdon Zimmerle on: 07/10/2024 09:21 AM   Modules accepted: Orders

## 2024-07-14 NOTE — Addendum Note (Signed)
 Addended by: Hasan Douse on: 07/14/2024 01:10 PM   Modules accepted: Orders

## 2024-07-14 NOTE — Telephone Encounter (Signed)
 I have placed the referral

## 2024-07-23 NOTE — Telephone Encounter (Signed)
 Pt was called by Nutrition office and they informed her that she would need to enroll in a program and Patient declined.

## 2024-08-11 ENCOUNTER — Telehealth: Payer: Self-pay | Admitting: Internal Medicine

## 2024-08-11 NOTE — Telephone Encounter (Signed)
 Copied from CRM #[id number]. Topic: General - Other >> Aug 11, 2024  4:22 PM Turkey B wrote:  Reason for CRM: patient called wants a copy of her last blood work that was done, so she can pick it up. Please cb when ready for pick up

## 2024-08-12 NOTE — Telephone Encounter (Signed)
 Called patient. Patient was informed that a copy of her most recent lab work has been printed out.

## 2024-09-10 ENCOUNTER — Ambulatory Visit (HOSPITAL_BASED_OUTPATIENT_CLINIC_OR_DEPARTMENT_OTHER)
Admission: RE | Admit: 2024-09-10 | Discharge: 2024-09-10 | Disposition: A | Discharge status: Home / Self Care | Source: Ambulatory Visit | Attending: Internal Medicine | Admitting: Internal Medicine

## 2024-09-10 ENCOUNTER — Other Ambulatory Visit: Payer: Medicare Other

## 2024-09-10 DIAGNOSIS — M8580 Other specified disorders of bone density and structure, unspecified site: Secondary | ICD-10-CM | POA: Insufficient documentation

## 2024-09-10 DIAGNOSIS — Z780 Asymptomatic menopausal state: Secondary | ICD-10-CM | POA: Diagnosis present

## 2024-09-11 ENCOUNTER — Ambulatory Visit: Payer: Self-pay | Admitting: Family Medicine

## 2024-10-19 ENCOUNTER — Encounter: Payer: Self-pay | Admitting: Internal Medicine

## 2024-10-19 ENCOUNTER — Ambulatory Visit: Attending: Internal Medicine | Admitting: Internal Medicine

## 2024-10-19 VITALS — BP 137/79 | HR 61 | Temp 97.6°F | Ht 70.0 in | Wt 185.0 lb

## 2024-10-19 DIAGNOSIS — E782 Mixed hyperlipidemia: Secondary | ICD-10-CM

## 2024-10-19 DIAGNOSIS — E559 Vitamin D deficiency, unspecified: Secondary | ICD-10-CM | POA: Diagnosis not present

## 2024-10-19 DIAGNOSIS — M85851 Other specified disorders of bone density and structure, right thigh: Secondary | ICD-10-CM | POA: Diagnosis not present

## 2024-10-19 DIAGNOSIS — I10 Essential (primary) hypertension: Secondary | ICD-10-CM

## 2024-10-19 DIAGNOSIS — M85852 Other specified disorders of bone density and structure, left thigh: Secondary | ICD-10-CM | POA: Diagnosis not present

## 2024-10-19 DIAGNOSIS — Z23 Encounter for immunization: Secondary | ICD-10-CM | POA: Diagnosis not present

## 2024-10-19 DIAGNOSIS — F330 Major depressive disorder, recurrent, mild: Secondary | ICD-10-CM

## 2024-10-19 MED ORDER — SERTRALINE HCL 100 MG PO TABS: 100.0000 mg | ORAL_TABLET | Freq: Every day | ORAL | 1 refills | Status: AC

## 2024-10-19 MED ORDER — QUETIAPINE FUMARATE 50 MG PO TABS: 50.0000 mg | ORAL_TABLET | Freq: Every day | ORAL | 1 refills | Status: AC

## 2024-10-19 MED ORDER — LISINOPRIL 10 MG PO TABS: 10.0000 mg | ORAL_TABLET | Freq: Every day | ORAL | 1 refills | Status: AC

## 2024-10-19 NOTE — Progress Notes (Signed)
 Patient ID: Ashley Padilla, female    DOB: 1951/11/23  MRN: [id number]  CC: Hypertension (HTN f/u. Med refills. /No questions / concerns/Yes to flu vax)   Subjective: Ashley Padilla is a 73 y.o. female who presents for chronic ds management. Her concerns today include:  Pt with hx of GAD/MDD followed by psychiatry, HTN, HL, hx of partial colon resection, vit D def/osteopenia preDM   Discussed the use of AI scribe software for clinical note transcription with the patient, who gave verbal consent to proceed.  History of Present Illness Ashley Padilla is a 73 year old female who presents for a four-month follow-up.  She continues to manage her hypertension with lisinopril  10 mg and amlodipine  5 mg daily. Her home blood pressure readings have been good, although she does not check them consistently. No chest pain or shortness of breath. She is mindful of her salt intake and ensures she consumes fruits and vegetables.  She engages in regular physical activity, attending an arthritis class on Mondays, a better balance class on Tuesdays, and cardio drumming on Wednesdays. She also walks at least three times a week to maintain her activity level.  For her osteopenia and vit d def, she takes vitamin D  3000 IU daily and believes her supplement includes calcium. A bone density test in July 2021 showed osteopenia, and a recent test showed osteopenia in both hips. She continues with weight-bearing exercises.  Her hyperlipidemia is being monitored, and she is not on statin medications. Her LDL cholesterol was 158 in February, down from 180 previously, with a goal of less than 100. She is due for another cholesterol check in March.  Requests refill on Seroquel  and Zoloft .  Reports overall she is doing okay on these medications.  She states that she has her moments here and there.  HM: She received her flu vaccine and has a Medicare wellness visit scheduled for next month.    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 04/13/2020   Nuclear sclerotic cataract of both eyes 04/13/2020   Screening mammogram, encounter for 10/19/2019   Adenomatous polyp of colon 09/07/2019   Post-menopausal bleeding 09/07/2019   Hyperlipidemia, mixed 06/25/2019   Vitamin D  deficiency 06/25/2019   Wrist pain, chronic, right 05/29/2019   Acute pain of right shoulder 05/29/2019   Essential hypertension 05/29/2019   History of wrist fracture 05/29/2019   Major depressive disorder, recurrent episode, in full remission 01/15/2019   GAD (generalized anxiety disorder) 01/15/2019     Current Outpatient Medications on File Prior to Visit  Medication Sig Dispense Refill   amLODipine  (NORVASC ) 5 MG tablet TAKE 1 TABLET (5 MG TOTAL) BY MOUTH DAILY. 90 tablet 1   cholecalciferol (VITAMIN D3) 25 MCG (1000 UNIT) tablet Take 2 tablets (2,000 Units total) by mouth daily. 100 tablet 1   diclofenac  sodium (VOLTAREN ) 1 % GEL Apply 2 g topically 4 (four) times daily as needed. 100 g 1   lisinopril  (ZESTRIL ) 10 MG tablet Take 1 tablet (10 mg total) by mouth daily. 90 tablet 1   Multiple Vitamins-Minerals (MULTIVITAMIN WITH IRON-MINERALS) liquid Take by mouth daily.     Omega-3 Fatty Acids (FISH OIL PO) Take 1 capsule by mouth daily with breakfast.     QUEtiapine  (SEROQUEL ) 50 MG tablet Take 1 tablet (50 mg total) by mouth daily. Ke 1 tablet (50 mg total) by mouth at bedtime 30 tablet 5  sertraline  (ZOLOFT ) 100 MG tablet Take 1 tablet (100 mg total) by mouth daily. 30 tablet 6   sulfamethoxazole -trimethoprim  (BACTRIM  DS) 800-160 MG tablet Take 1 tablet by mouth 2 (two) times daily. (Patient not taking: Reported on 10/19/2024) 14 tablet 0   No current facility-administered medications on file prior to visit.    Allergies  Allergen Reactions   Penicillin V Potassium Other (See Comments)    Reaction happened as a baby- not recalled Did it involve swelling of  the face/tongue/throat, SOB, or low BP? Unk Did it involve sudden or severe rash/hives, skin peeling, or any reaction on the inside of your mouth or nose? Unk Did you need to seek medical attention at a hospital or doctor's office? Unk When did it last happen? I was a baby If all above answers are "NO", may proceed with cephalosporin use.     Penicillins Other (See Comments)    Reaction happened as a baby- not recalled Did it involve swelling of the face/tongue/throat, SOB, or low BP? Unk Did it involve sudden or severe rash/hives, skin peeling, or any reaction on the inside of your mouth or nose? Unk Did you need to seek medical attention at a hospital or doctor's office? Unk When did it last happen? I was a baby If all above answers are NO, may proceed with cephalosporin use.   Latex    Adhesive [Tape] Rash    Terrible rash developed at incision site    Social History   Socioeconomic History   Marital status: Widowed    Spouse name: Not on file   Number of children: 3   Years of education: BS degree   Highest education level: Bachelor's degree (e.g., BA, AB, BS)  Occupational History   Occupation: retired runner, broadcasting/film/video  Tobacco Use   Smoking status: Never   Smokeless tobacco: Never  Vaping Use   Vaping status: Never Used  Substance and Sexual Activity   Alcohol use: Not Currently   Drug use: Not Currently   Sexual activity: Not Currently  Other Topics Concern   Not on file  Social History Narrative   Not on file   Social Drivers of Health   Financial Resource Strain: Low Risk  (10/15/2024)   Overall Financial Resource Strain (CARDIA)    Difficulty of Paying Living Expenses: Not very hard  Food Insecurity: No Food Insecurity (10/15/2024)   Hunger Vital Sign    Worried About Running Out of Food in the Last Year: Never true    Ran Out of Food in the Last Year: Never true  Transportation Needs: No Transportation Needs (10/15/2024)   PRAPARE - Scientist, Research (physical Sciences) (Medical): No    Lack of Transportation (Non-Medical): No  Physical Activity: Sufficiently Active (10/15/2024)   Exercise Vital Sign    Days of Exercise per Week: 5 days    Minutes of Exercise per Session: 60 min  Stress: No Stress Concern Present (10/15/2024)   [employer] of Occupational Health - Occupational Stress Questionnaire    Feeling of Stress: Only a little  Social Connections: Socially Isolated (10/15/2024)   Social Connection and Isolation Panel    Frequency of Communication with Friends and Family: More than three times a week    Frequency of Social Gatherings with Friends and Family: Three times a week    Attends Religious Services: Patient declined    Active Member of Clubs or Organizations: No    Attends Banker Meetings: Not on file  Marital Status: Widowed  Intimate Partner Violence: Not At Risk (10/15/2023)   Received from [employer], Afraid, Rape, and Kick questionnaire    Within the last year, have you been afraid of your partner or ex-partner?: No    Within the last year, have you been humiliated or emotionally abused in other ways by your partner or ex-partner?: No    Within the last year, have you been kicked, hit, slapped, or otherwise physically hurt by your partner or ex-partner?: No    Within the last year, have you been raped or forced to have any kind of sexual activity by your partner or ex-partner?: No    Family History  Problem Relation Age of Onset   Anxiety disorder Mother    Depression Mother    Alcohol abuse Father    Anxiety disorder Sister    Depression Sister    Breast cancer Sister    Anxiety disorder Maternal Grandmother    Depression Maternal Grandmother     Past Surgical History:  Procedure Laterality Date   COLON RESECTION N/A 11/2009   for presumed cancerous polyp that turned out not to be CA   COLON SURGERY  2010   Colon Resection   COLON SURGERY  2011   Ostomy reversal     polypectomy uterous  2012   WRIST FRACTURE SURGERY Bilateral 2018, 03/2019   LT 2018 and RT 2020   WRIST SURGERY Left     ROS: Review of Systems Negative except as stated above  PHYSICAL EXAM: BP 134/82 (BP Location: Left Arm, Patient Position: Sitting, Cuff Size: Normal)   Pulse 61   Temp 97.6 F (36.4 C) (Oral)   Ht 5' 10 (1.778 m)   Wt 185 lb (83.9 kg)   SpO2 99%   BMI 26.54 kg/m   Wt Readings from Last 3 Encounters:  10/19/24 185 lb (83.9 kg)  06/16/24 191 lb (86.6 kg)  01/17/24 191 lb (86.6 kg)    Physical Exam  General appearance - alert, well appearing, older female and in no distress Mental status - normal mood, behavior, speech, dress, motor activity, and thought processes Neck - supple, no significant adenopathy Chest - clear to auscultation, no wheezes, rales or rhonchi, symmetric air entry Heart - normal rate, regular rhythm, normal S1, S2, no murmurs, rubs, clicks or gallops Extremities - peripheral pulses normal, no pedal edema, no clubbing or cyanosis      Latest Ref Rng & Units 01/17/2024    9:44 AM 11/16/2020   10:35 AM 06/24/2019   10:08 AM  CMP  Glucose 70 - 99 mg/dL 895  93  92   BUN 8 - 27 mg/dL 15  14  13    Creatinine 0.57 - 1.00 mg/dL 9.08  9.10  9.10   Sodium 134 - 144 mmol/L 144  143  145   Potassium 3.5 - 5.2 mmol/L 4.0  3.7  4.0   Chloride 96 - 106 mmol/L 105  103  105   CO2 20 - 29 mmol/L 22  25  23    Calcium 8.7 - 10.3 mg/dL 9.0  8.9  9.2   Total Protein 6.0 - 8.5 g/dL 6.5  6.6  6.6   Total Bilirubin 0.0 - 1.2 mg/dL 0.3  0.4  0.6   Alkaline Phos 44 - 121 IU/L 59  61  70   AST 0 - 40 IU/L 20  17  15    ALT 0 - 32 IU/L 15  14  20    Lipid Panel     Component Value Date/Time   CHOL 247 (H) 01/17/2024 0944   TRIG 93 01/17/2024 0944   HDL 73 01/17/2024 0944   CHOLHDL 3.4 01/17/2024 0944   LDLCALC 158 (H) 01/17/2024 0944    CBC    Component Value Date/Time   WBC 6.4 06/16/2024 1557   RBC 4.70 06/16/2024 1557   HGB 14.2  06/16/2024 1557   HCT 43.5 06/16/2024 1557   PLT 150 06/16/2024 1557   MCV 93 06/16/2024 1557   MCH 30.2 06/16/2024 1557   MCHC 32.6 06/16/2024 1557   RDW 13.5 06/16/2024 1557    ASSESSMENT AND PLAN: 1. Essential hypertension Not at goal Recommended increase Norvasc  to 10 mg; pt wants to hold off on increase. Agrees to check BP 2 x/wk with goal 130/80 or lower. If consistently higher, she will send me Mychart message Continue Norvasc  5 mg and Lisinopril  10 mg daily - lisinopril  (ZESTRIL ) 10 MG tablet; Take 1 tablet (10 mg total) by mouth daily.  Dispense: 90 tablet; Refill: 1  2. Osteopenia of both hips Continue Vit D 3000 international units daily and Ca supplement.  She will call back to confirm that she is taking calcium.  Encouraged her to continue weightbearing exercises.  3. Vitamin D  deficiency On vitamin D  supplement as mentioned above.  Last vitamin D  level was checked 4 months ago and was within normal range  4. Need for immunization against influenza (Primary) given  5. Hyperlipidemia, mixed Encouraged her to continue regular exercise and healthy eating habits  6. Major depressive disorder, recurrent episode, mild Refill given on Seroquel  and Zoloft  - QUEtiapine  (SEROQUEL ) 50 MG tablet; Take 1 tablet (50 mg total) by mouth daily. Ke 1 tablet (50 mg total) by mouth at bedtime  Dispense: 90 tablet; Refill: 1 - sertraline  (ZOLOFT ) 100 MG tablet; Take 1 tablet (100 mg total) by mouth daily.  Dispense: 90 tablet; Refill: 1   Patient was given the opportunity to ask questions.  Patient verbalized understanding of the plan and was able to repeat key elements of the plan.   This documentation was completed using Paediatric nurse.  Any transcriptional errors are unintentional.  Orders Placed This Encounter  Procedures   Flu vaccine trivalent PF, 6mos and older(Flulaval,Afluria,Fluarix,Fluzone)     Requested Prescriptions   Pending Prescriptions Disp  Refills   QUEtiapine  (SEROQUEL ) 50 MG tablet 90 tablet 1    Sig: Take 1 tablet (50 mg total) by mouth daily. Ke 1 tablet (50 mg total) by mouth at bedtime   sertraline  (ZOLOFT ) 100 MG tablet 90 tablet 1    Sig: Take 1 tablet (100 mg total) by mouth daily.   lisinopril  (ZESTRIL ) 10 MG tablet 90 tablet 1    Sig: Take 1 tablet (10 mg total) by mouth daily.    No follow-ups on file.  Barnie Louder, MD, FACP

## 2024-10-19 NOTE — Patient Instructions (Signed)
  VISIT SUMMARY: Today, we reviewed your management plan for hypertension, osteopenia, and hyperlipidemia. We also discussed your recent flu vaccination and upcoming wellness visit.  YOUR PLAN: -ESSENTIAL HYPERTENSION: Hypertension means high blood pressure. You are currently taking lisinopril  10 mg and amlodipine  5 mg daily. Please continue these medications and monitor your blood pressure at home once or twice a week. If your readings are consistently above 130/80, send a MyChart message to consider increasing your amlodipine  dose to 10 mg daily.  -OSTEOPENIA OF BILATERAL HIPS: Osteopenia means your bones are weaker than normal but not so far gone that they break easily. Your recent bone density test showed worsening in your left hip. Continue with your weight-bearing exercises and vitamin D  supplementation. Ensure you are getting at least 600 mg of calcium twice a day.  -VITAMIN D  DEFICIENCY: Vitamin D  deficiency means you have lower than normal levels of vitamin D , which is important for bone health. Continue taking your vitamin D  3000 IU daily.  -MIXED HYPERLIPIDEMIA: Mixed hyperlipidemia means you have high levels of cholesterol in your blood. Your LDL cholesterol has decreased but is still above the goal. Continue with your lifestyle modifications to lower your LDL cholesterol, and we will recheck your cholesterol levels in March.  -ENCOUNTER FOR IMMUNIZATION: You received your flu vaccine today. Continue with your routine immunizations as scheduled.  INSTRUCTIONS: Please monitor your blood pressure at home once or twice a week. If your readings are consistently above 130/80, send a MyChart message to consider increasing your amlodipine  dose to 10 mg daily. Recheck your cholesterol levels in March.                      Contains text generated by [employer]bridge.                                 Contains text generated by Abridge.

## 2024-12-02 ENCOUNTER — Encounter (HOSPITAL_BASED_OUTPATIENT_CLINIC_OR_DEPARTMENT_OTHER): Payer: Self-pay | Admitting: Emergency Medicine

## 2024-12-02 ENCOUNTER — Emergency Department (HOSPITAL_BASED_OUTPATIENT_CLINIC_OR_DEPARTMENT_OTHER)
Admission: EM | Admit: 2024-12-02 | Discharge: 2024-12-02 | Disposition: A | Discharge status: Home / Self Care | Source: Ambulatory Visit | Attending: Emergency Medicine | Admitting: Emergency Medicine

## 2024-12-02 ENCOUNTER — Emergency Department (HOSPITAL_BASED_OUTPATIENT_CLINIC_OR_DEPARTMENT_OTHER)

## 2024-12-02 ENCOUNTER — Other Ambulatory Visit: Payer: Self-pay

## 2024-12-02 DIAGNOSIS — Z79899 Other long term (current) drug therapy: Secondary | ICD-10-CM | POA: Insufficient documentation

## 2024-12-02 DIAGNOSIS — M79602 Pain in left arm: Secondary | ICD-10-CM | POA: Diagnosis present

## 2024-12-02 DIAGNOSIS — I10 Essential (primary) hypertension: Secondary | ICD-10-CM | POA: Insufficient documentation

## 2024-12-02 DIAGNOSIS — S52355A Nondisplaced comminuted fracture of shaft of radius, left arm, initial encounter for closed fracture: Secondary | ICD-10-CM | POA: Diagnosis not present

## 2024-12-02 DIAGNOSIS — W08XXXA Fall from other furniture, initial encounter: Secondary | ICD-10-CM | POA: Diagnosis not present

## 2024-12-02 DIAGNOSIS — Z91040 Latex allergy status: Secondary | ICD-10-CM | POA: Insufficient documentation

## 2024-12-02 DIAGNOSIS — Y9301 Activity, walking, marching and hiking: Secondary | ICD-10-CM | POA: Diagnosis not present

## 2024-12-02 DIAGNOSIS — S4992XA Unspecified injury of left shoulder and upper arm, initial encounter: Secondary | ICD-10-CM | POA: Diagnosis present

## 2024-12-02 NOTE — Discharge Instructions (Addendum)
 Your x-rays were concerning for a radial fracture today.  We have given you a splint, please use your sling as needed for comfort.  Do not get your splint wet.  Please call the number attached to discharge paperwork for orthopedic follow-up as soon as possible.  Use Tylenol  and ibuprofen as needed for pain management.  If you experience any concerning new or worsening symptoms please return the ED for further evaluation.  Some of the symptoms would include discoloration of skin or loss of feeling in your hand or arm.

## 2024-12-02 NOTE — ED Notes (Signed)
 DC paperwork given and verbally understood.

## 2024-12-02 NOTE — ED Triage Notes (Signed)
 Fall/ trip last night [place] out a couch Seen at [facility] sent for eval for rib contusions Denies SOB Left arm + FX Denies LOC no head injury

## 2024-12-02 NOTE — ED Provider Notes (Signed)
 " Ashley Padilla   CSN: [id number] Arrival date & time: 12/02/24  1224     Patient presents with: Ashley Padilla is a 73 y.o. female.  73 year old female presents ED with complaints of left arm fracture.  Patient reports she had a mechanical fall last night.  She reports she is dog sitting at a friend's home and sleeping on the couch.  She woke up in the middle of the night to use the restroom.  She was walking to the restroom she tripped over a chair and landed on her buttocks and caught herself with her left arm.  Patient reports she was able to get to the restroom and returned to the couch and returned back to sleep.  Patient denied any loss of consciousness, no blood thinners, denied any other injuries.  Patient went to [facility] walk-in clinic this morning and received x-rays of her left arm and ribs.  Patient was advised to go to the ED for further evaluation because they advised that she had a left radial fracture and pulmonary contusions with no rib fractures.  Patient has significant history of hypertension depression and anxiety.  She has had surgery in the past with only the left wrist.      Prior to Admission medications  Medication Sig Start Date End Date Taking? Authorizing Provider  amLODipine  (NORVASC ) 5 MG tablet TAKE 1 TABLET (5 MG TOTAL) BY MOUTH DAILY. 07/08/24   Vicci Barnie NOVAK, MD  cholecalciferol (VITAMIN D3) 25 MCG (1000 UNIT) tablet Take 2 tablets (2,000 Units total) by mouth daily. 11/17/20   Vicci Barnie NOVAK, MD  diclofenac  sodium (VOLTAREN ) 1 % GEL Apply 2 g topically 4 (four) times daily as needed. 05/29/19   Vicci Barnie NOVAK, MD  lisinopril  (ZESTRIL ) 10 MG tablet Take 1 tablet (10 mg total) by mouth daily. 10/19/24   Vicci Barnie NOVAK, MD  Multiple Vitamins-Minerals (MULTIVITAMIN WITH IRON-MINERALS) liquid Take by mouth daily.    [provider]  Omega-3 Fatty Acids (FISH OIL PO) Take 1  capsule by mouth daily with breakfast.    [provider]  QUEtiapine  (SEROQUEL ) 50 MG tablet Take 1 tablet (50 mg total) by mouth daily. Ke 1 tablet (50 mg total) by mouth at bedtime 10/19/24   Vicci Barnie NOVAK, MD  sertraline  (ZOLOFT ) 100 MG tablet Take 1 tablet (100 mg total) by mouth daily. 10/19/24   Vicci Barnie NOVAK, MD    Allergies: Penicillin v potassium, Penicillins, Latex, and Adhesive [tape]    Review of Systems  Musculoskeletal:  Positive for myalgias.  Skin:  Positive for wound.  All other systems reviewed and are negative.   Updated Vital Signs BP (!) 147/83 (BP Location: Right Arm)   Pulse 79   Temp 98.2 F (36.8 C)   Resp 16   SpO2 96%   Physical Exam Vitals and nursing Padilla reviewed.  Constitutional:      Appearance: Normal appearance.  HENT:     Head: Normocephalic and atraumatic.     Nose: Nose normal.  Eyes:     Extraocular Movements: Extraocular movements intact.     Conjunctiva/sclera: Conjunctivae normal.     Pupils: Pupils are equal, round, and reactive to light.  Cardiovascular:     Rate and Rhythm: Normal rate.  Pulmonary:     Effort: Pulmonary effort is normal. No respiratory distress.  Abdominal:     General: Abdomen is flat. There is no distension.  Palpations: Abdomen is soft.     Tenderness: There is no abdominal tenderness. There is no guarding.  Musculoskeletal:     Cervical back: Normal range of motion.     Comments: Patient has full sensation, cap refill less than 3 seconds, good pulses.  Patient is able to move wrist with minimal pain and move all fingers without difficulty.  No pain to palpation to bilateral clavicles and no pain to palpation to right shoulder with full range of motion of shoulder.  No bruising noted throughout chest wall or flanks.  Patient has clear lungs auscultation all fields.  Skin:    General: Skin is warm.     Capillary Refill: Capillary refill takes less than 2 seconds.  Neurological:      General: No focal deficit present.     Mental Status: She is alert.  Psychiatric:        Mood and Affect: Mood normal.        Behavior: Behavior normal.     (all labs ordered are listed, but only abnormal results are displayed) Labs Reviewed - No data to display  EKG: None  Radiology: No results found.   Procedures   Medications Ordered in the ED - No data to display  73 y.o. female presents to the ED with complaints of left arm pain with reported diagnosed fracture, The differential diagnosis includes fracture, dislocation, clavicle fracture, shoulder dislocation, neurovascular injury, artery injury (Ddx)  On arrival pt is nontoxic, vitals unremarkable. Exam significant for swelling over the midshaft of the left radius.  Imaging Studies ordered:  I ordered imaging studies which included continued left forearm, complete left hand, and bilateral rib.  Patient refused everything but the left forearm.  Left forearm x-ray significant for mildly angulated possible comminuted acute fracture involving midshaft of left radius  ED Course:   Patient sitting comfortably in ED bed in no acute distress nontoxic-appearing with sling she had from previous injury.  Patient reports she was advised to come here for further evaluation and treatment ended with a orthopedic clinic for open today.  We do not have access to previous x-ray we will repeat x-rays today and place patient in splint.  Patient refused all x-rays except for left forearm.  X-ray was remarkable for left midshaft radial fracture.  Patient will be placed in ulnar gutter splint and referred to orthopedic for outpatient management.  Patient was advised of strict return precautions and was comfortable with discharge at this time.  Portions of this Padilla were generated with Scientist, clinical (histocompatibility and immunogenetics). Dictation errors may occur despite best attempts at proofreading.   Final diagnoses:  Closed nondisplaced comminuted fracture of  shaft of left radius, initial encounter    ED Discharge Orders     None          Ashley Padilla 12/05/24 1946    Armenta Canning, MD 12/19/24 [phone]  "

## 2024-12-08 ENCOUNTER — Ambulatory Visit

## 2024-12-08 VITALS — Ht 70.0 in | Wt 188.0 lb

## 2024-12-08 DIAGNOSIS — Z0000 Encounter for general adult medical examination without abnormal findings: Secondary | ICD-10-CM

## 2024-12-08 NOTE — Patient Instructions (Signed)
 Ashley Padilla,  Thank you for taking the time for your Medicare Wellness Visit. I appreciate your continued commitment to your health goals. Please review the care plan we discussed, and feel free to reach out if I can assist you further.  Please note that Annual Wellness Visits do not include a physical exam. Some assessments may be limited, especially if the visit was conducted virtually. If needed, we may recommend an in-person follow-up with your provider.  Ongoing Care Seeing your primary care provider every 3 to 6 months helps us  monitor your health and provide consistent, personalized care.   Referrals If a referral was made during today's visit and you haven't received any updates within two weeks, please contact the referred provider directly to check on the status.  Recommended Screenings:  Health Maintenance  Topic Date Due   COVID-19 Vaccine (3 - 2025-26 season) 08/10/2024   Medicare Annual Wellness Visit  10/14/2024   Colon Cancer Screening  06/19/2025*   Stool Blood Test  06/18/2025   Breast Cancer Screening  01/21/2026   DTaP/Tdap/Td vaccine (3 - Td or Tdap) 03/15/2028   Pneumococcal Vaccine for age over 29  Completed   Flu Shot  Completed   Osteoporosis screening with Bone Density Scan  Completed   Hepatitis C Screening  Completed   Zoster (Shingles) Vaccine  Completed   Meningitis B Vaccine  Aged Out   Hepatitis B Vaccine  Discontinued  *Topic was postponed. The date shown is not the original due date.       12/08/2024   10:04 AM  Advanced Directives  Does Patient Have a Medical Advance Directive? Yes  Type of Estate Agent of [place];Living will  Copy of Healthcare Power of Attorney in Chart? No - copy requested  Would patient like information on creating a medical advance directive? No - Patient declined    Vision: Annual vision screenings are recommended for early detection of glaucoma, cataracts, and diabetic retinopathy. These exams can  also reveal signs of chronic conditions such as diabetes and high blood pressure.  Dental: Annual dental screenings help detect early signs of oral cancer, gum disease, and other conditions linked to overall health, including heart disease and diabetes.  Please see the attached documents for additional preventive care recommendations.

## 2024-12-08 NOTE — Progress Notes (Signed)
 "  Chief Complaint  Patient presents with   Medicare Wellness    SUBSEQUENT     Subjective:   Ashley Padilla is a 73 y.o. female who presents for a Medicare Annual Wellness Visit.  Visit info / Clinical Intake: Medicare Wellness Visit Type:: Subsequent Annual Wellness Visit Persons participating in visit and providing information:: patient Medicare Wellness Visit Mode:: Telephone If telephone:: video declined Since this visit was completed virtually, some vitals may be partially provided or unavailable. Missing vitals are due to the limitations of the virtual format.: Documented vitals are patient reported If Telephone or Video please confirm:: I connected with patient using audio/video enable telemedicine. I verified patient identity with two identifiers, discussed telehealth limitations, and patient agreed to proceed. Patient Location:: HOME Provider Location:: HOME OFFICE Interpreter Needed?: No Pre-visit prep was completed: yes AWV questionnaire completed by patient prior to visit?: no Living arrangements:: (!) lives alone Patient's Overall Health Status Rating: good Typical amount of pain: some Does pain affect daily life?: no Are you currently prescribed opioids?: no  Dietary Habits and Nutritional Risks How many meals a day?: 2 Eats fruit and vegetables daily?: yes Most meals are obtained by: eating out Diabetic:: no  Functional Status Activities of Daily Living (to include ambulation/medication): Independent Ambulation: Independent with device- listed below Home Assistive Devices/Equipment: Splint (specify type); Eyeglasses Medication Administration: Independent Home Management (perform basic housework or laundry): Independent Manage your own finances?: yes Primary transportation is: driving Concerns about vision?: no *vision screening is required for WTM*  Fall Screening Falls in the past year?: 1 Number of falls in past year: 0 Was there an injury with Fall?:  1 Fall Risk Category Calculator: 2 Patient Fall Risk Level: Moderate Fall Risk  Fall Risk Fall risk Follow up: Falls evaluation completed; Education provided  Home and Transportation Safety: All rugs have non-skid backing?: yes All stairs or steps have railings?: yes Grab bars in the bathtub or shower?: (!) no Have non-skid surface in bathtub or shower?: yes Good home lighting?: yes Regular seat belt use?: yes Hospital stays in the last year:: no  Cognitive Assessment Difficulty concentrating, remembering, or making decisions? : no (BRAIN STIMULATING EXERCISES: [place]  TIMES SUBSCRIPTION, READING, GAMES ON PHONE) Will 6CIT or Mini Cog be Completed: yes What year is it?: 0 points What month is it?: 0 points Give patient an address phrase to remember (5 components): [name] [address] About what time is it?: 0 points Count backwards from 20 to 1: 0 points Say the months of the year in reverse: 0 points Repeat the address phrase from earlier: 0 points 6 CIT Score: 0 points  Advance Directives (For Healthcare) Does Patient Have a Medical Advance Directive?: Yes Type of Advance Directive: Healthcare Power of [place]; Living will Copy of Healthcare Power of Attorney in Chart?: No - copy requested Copy of Living Will in Chart?: No - copy requested Would patient like information on creating a medical advance directive?: No - Patient declined  Reviewed/Updated  Reviewed/Updated: Reviewed All (Medical, Surgical, Family, Medications, Allergies, Care Teams, Patient Goals)    Allergies (verified) Penicillin v potassium, Penicillins, Latex, and Adhesive [tape]   Current Medications (verified) Outpatient Encounter Medications as of 12/08/2024  Medication Sig   amLODipine  (NORVASC ) 5 MG tablet TAKE 1 TABLET (5 MG TOTAL) BY MOUTH DAILY.   cholecalciferol (VITAMIN D3) 25 MCG (1000 UNIT) tablet Take 2 tablets (2,000 Units total) by mouth daily.   diclofenac  sodium  (VOLTAREN ) 1 % GEL  Apply 2 g topically 4 (four) times daily as needed.   lisinopril  (ZESTRIL ) 10 MG tablet Take 1 tablet (10 mg total) by mouth daily.   Multiple Vitamins-Minerals (MULTIVITAMIN WITH IRON-MINERALS) liquid Take by mouth daily.   niacin 250 MG CR capsule Take 400 mg by mouth at bedtime.   Omega-3 Fatty Acids (FISH OIL PO) Take 1 capsule by mouth daily with breakfast.   QUEtiapine  (SEROQUEL ) 50 MG tablet Take 1 tablet (50 mg total) by mouth daily. Ke 1 tablet (50 mg total) by mouth at bedtime   sertraline  (ZOLOFT ) 100 MG tablet Take 1 tablet (100 mg total) by mouth daily.   No facility-administered encounter medications on file as of 12/08/2024.    History: Past Medical History:  Diagnosis Date   Anxiety    Depression    Hypertension 2016   Past Surgical History:  Procedure Laterality Date   COLON RESECTION N/A 11/2009   for presumed cancerous polyp that turned out not to be CA   COLON SURGERY  2010   Colon Resection   COLON SURGERY  2011   Ostomy reversal    polypectomy uterous  2012   WRIST FRACTURE SURGERY Bilateral 2018, 03/2019   LT 2018 and RT 2020   WRIST SURGERY Left    Family History  Problem Relation Age of Onset   Anxiety disorder Mother    Depression Mother    Alcohol abuse Father    Anxiety disorder Sister    Depression Sister    Breast cancer Sister    Anxiety disorder Maternal Grandmother    Depression Maternal Grandmother    Social History   Occupational History   Occupation: retired runner, broadcasting/film/video  Tobacco Use   Smoking status: Never   Smokeless tobacco: Never  Vaping Use   Vaping status: Never Used  Substance and Sexual Activity   Alcohol use: Not Currently   Drug use: Not Currently   Sexual activity: Not Currently   Tobacco Counseling Counseling given: Not Answered  SDOH Screenings   Food Insecurity: No Food Insecurity (12/08/2024)  Housing: Low Risk (12/08/2024)  Transportation Needs: No Transportation Needs (12/08/2024)   Utilities: Not At Risk (12/08/2024)  Alcohol Screen: Low Risk (12/08/2024)  Depression (PHQ2-9): Low Risk (12/08/2024)  Recent Concern: Depression (PHQ2-9) - Medium Risk (10/19/2024)  Financial Resource Strain: Low Risk (12/08/2024)  Physical Activity: Sufficiently Active (12/08/2024)  Social Connections: Socially Isolated (12/08/2024)  Stress: No Stress Concern Present (12/08/2024)  Tobacco Use: Low Risk (12/08/2024)  Health Literacy: Adequate Health Literacy (12/08/2024)   See flowsheets for full screening details  Depression Screen PHQ 2 & 9 Depression Scale- Over the past 2 weeks, how often have you been bothered by any of the following problems? Little interest or pleasure in doing things: 0 Feeling down, depressed, or hopeless (PHQ Adolescent also includes...irritable): 0 PHQ-2 Total Score: 0 Trouble falling or staying asleep, or sleeping too much: 0 Feeling tired or having little energy: 0 Poor appetite or overeating (PHQ Adolescent also includes...weight loss): 0 Feeling bad about yourself - or that you are a failure or have let yourself or your family down: 0 Trouble concentrating on things, such as reading the newspaper or watching television (PHQ Adolescent also includes...like school work): 0 Moving or speaking so slowly that other people could have noticed. Or the opposite - being so fidgety or restless that you have been moving around a lot more than usual: 0 Thoughts that you would be better off dead, or of hurting yourself in some  way: 0 PHQ-9 Total Score: 0 If you checked off any problems, how difficult have these problems made it for you to do your work, take care of things at home, or get along with other people?: Not difficult at all  Depression Treatment Depression Interventions/Treatment : Medication     Goals Addressed             This Visit's Progress    12/08/24: I would like to lose weight and get down to 175-180 pounds.                Objective:    Today's Vitals   12/08/24 0956  Weight: 188 lb (85.3 kg)  Height: 5' 10 (1.778 m)  PainSc: 3   PainLoc: Generalized   Body mass index is 26.98 kg/m.  Hearing/Vision screen Hearing Screening - Comments:: Denies hearing difficulties.   Vision Screening - Comments:: Wears rx glasses - up to date with routine eye exams with Dr. Chad Frazier with [facility]  Immunizations and Health Maintenance Health Maintenance  Topic Date Due   COVID-19 Vaccine (3 - 2025-26 season) 08/10/2024   Colonoscopy  06/19/2025 (Originally 12/30/2021)   COLON CANCER SCREENING ANNUAL FOBT  06/18/2025   Medicare Annual Wellness (AWV)  12/08/2025   Mammogram  01/21/2026   DTaP/Tdap/Td (3 - Td or Tdap) 03/15/2028   Pneumococcal Vaccine: 50+ Years  Completed   Influenza Vaccine  Completed   Bone Density Scan  Completed   Hepatitis C Screening  Completed   Zoster Vaccines- Shingrix  Completed   Meningococcal B Vaccine  Aged Out   Hepatitis B Vaccines 19-59 Average Risk  Discontinued        Assessment/Plan:  This is a routine wellness examination for [place].  Patient Care Team: Vicci Barnie NOVAK, MD as PCP - General (Internal Medicine) Frazier, Chad, OD as Consulting Physician (Optometry)  I have personally reviewed and noted the following in the patients chart:   Medical and social history Use of alcohol, tobacco or illicit drugs  Current medications and supplements including opioid prescriptions. Functional ability and status Nutritional status Physical activity Advanced directives List of other physicians Hospitalizations, surgeries, and ER visits in previous 12 months Vitals Screenings to include cognitive, depression, and falls Referrals and appointments  No orders of the defined types were placed in this encounter.  In addition, I have reviewed and discussed with patient certain preventive protocols, quality metrics, and best practice recommendations. A  written personalized care plan for preventive services as well as general preventive health recommendations were provided to patient.   Roz LOISE Fuller, LPN   [id number]   Return in about 1 year (around 12/08/2025) for Medicare wellness.  After Visit Summary: (MyChart) Due to this being a telephonic visit, the after visit summary with patients personalized plan was offered to patient via MyChart   Nurse Notes: Nurse will request medical records from [facility]. "

## 2025-02-18 ENCOUNTER — Ambulatory Visit: Admitting: Internal Medicine
